# Patient Record
Sex: Female | Born: 1935 | Race: White | Hispanic: No | Smoking: Former smoker
Health system: Southern US, Community
[De-identification: ages and names within clinical notes are randomized; demographics above are authoritative.]

## PROBLEM LIST (undated history)

## (undated) DIAGNOSIS — I2510 Atherosclerotic heart disease of native coronary artery without angina pectoris: Secondary | ICD-10-CM

## (undated) DIAGNOSIS — E785 Hyperlipidemia, unspecified: Secondary | ICD-10-CM

## (undated) DIAGNOSIS — I714 Abdominal aortic aneurysm, without rupture, unspecified: Secondary | ICD-10-CM

## (undated) DIAGNOSIS — I6529 Occlusion and stenosis of unspecified carotid artery: Secondary | ICD-10-CM

## (undated) DIAGNOSIS — S72009A Fracture of unspecified part of neck of unspecified femur, initial encounter for closed fracture: Secondary | ICD-10-CM

## (undated) DIAGNOSIS — I351 Nonrheumatic aortic (valve) insufficiency: Secondary | ICD-10-CM

## (undated) DIAGNOSIS — M1990 Unspecified osteoarthritis, unspecified site: Secondary | ICD-10-CM

## (undated) DIAGNOSIS — I10 Essential (primary) hypertension: Secondary | ICD-10-CM

## (undated) DIAGNOSIS — M8580 Other specified disorders of bone density and structure, unspecified site: Secondary | ICD-10-CM

## (undated) DIAGNOSIS — I639 Cerebral infarction, unspecified: Secondary | ICD-10-CM

## (undated) HISTORY — PX: TONSILLECTOMY: SUR1361

## (undated) HISTORY — PX: FRACTURE SURGERY: SHX138

## (undated) HISTORY — PX: EYE SURGERY: SHX253

## (undated) HISTORY — PX: HIP FRACTURE SURGERY: SHX118

## (undated) HISTORY — PX: ABDOMINAL HYSTERECTOMY: SHX81

## (undated) HISTORY — DX: Nonrheumatic aortic (valve) insufficiency: I35.1

## (undated) HISTORY — DX: Cerebral infarction, unspecified: I63.9

## (undated) HISTORY — DX: Hyperlipidemia, unspecified: E78.5

## (undated) HISTORY — DX: Fracture of unspecified part of neck of unspecified femur, initial encounter for closed fracture: S72.009A

## (undated) HISTORY — DX: Abdominal aortic aneurysm, without rupture: I71.4

## (undated) HISTORY — DX: Essential (primary) hypertension: I10

## (undated) HISTORY — PX: APPENDECTOMY: SHX54

## (undated) HISTORY — DX: Abdominal aortic aneurysm, without rupture, unspecified: I71.40

## (undated) HISTORY — PX: CATARACT EXTRACTION: SUR2

## (undated) HISTORY — DX: Atherosclerotic heart disease of native coronary artery without angina pectoris: I25.10

## (undated) HISTORY — PX: CHOLECYSTECTOMY: SHX55

## (undated) HISTORY — DX: Occlusion and stenosis of unspecified carotid artery: I65.29

## (undated) HISTORY — PX: PTCA: SHX146

## (undated) HISTORY — DX: Other specified disorders of bone density and structure, unspecified site: M85.80

## (undated) HISTORY — DX: Unspecified osteoarthritis, unspecified site: M19.90

---

## 1998-01-27 ENCOUNTER — Emergency Department (HOSPITAL_COMMUNITY): Admission: EM | Admit: 1998-01-27 | Discharge: 1998-01-27 | Payer: Self-pay

## 1998-07-25 HISTORY — PX: CARDIAC CATHETERIZATION: SHX172

## 1998-11-30 ENCOUNTER — Inpatient Hospital Stay (HOSPITAL_COMMUNITY): Admission: EM | Admit: 1998-11-30 | Discharge: 1998-12-03 | Payer: Self-pay | Admitting: Emergency Medicine

## 1998-11-30 ENCOUNTER — Encounter: Payer: Self-pay | Admitting: Emergency Medicine

## 1999-09-17 ENCOUNTER — Encounter: Payer: Self-pay | Admitting: Cardiovascular Disease

## 1999-09-17 ENCOUNTER — Ambulatory Visit (HOSPITAL_COMMUNITY): Admission: RE | Admit: 1999-09-17 | Discharge: 1999-09-17 | Payer: Self-pay | Admitting: Cardiovascular Disease

## 2001-02-08 ENCOUNTER — Encounter: Payer: Self-pay | Admitting: Obstetrics and Gynecology

## 2001-02-08 ENCOUNTER — Encounter: Admission: RE | Admit: 2001-02-08 | Discharge: 2001-02-08 | Payer: Self-pay | Admitting: Obstetrics and Gynecology

## 2003-06-03 ENCOUNTER — Encounter: Admission: RE | Admit: 2003-06-03 | Discharge: 2003-06-03 | Payer: Self-pay | Admitting: Obstetrics and Gynecology

## 2003-08-19 ENCOUNTER — Emergency Department (HOSPITAL_COMMUNITY): Admission: AD | Admit: 2003-08-19 | Discharge: 2003-08-19 | Payer: Self-pay

## 2004-01-24 ENCOUNTER — Emergency Department (HOSPITAL_COMMUNITY): Admission: EM | Admit: 2004-01-24 | Discharge: 2004-01-24 | Payer: Self-pay | Admitting: Family Medicine

## 2004-07-17 ENCOUNTER — Emergency Department (HOSPITAL_COMMUNITY): Admission: EM | Admit: 2004-07-17 | Discharge: 2004-07-17 | Payer: Self-pay | Admitting: Family Medicine

## 2004-08-24 ENCOUNTER — Ambulatory Visit: Payer: Self-pay | Admitting: Internal Medicine

## 2006-01-13 ENCOUNTER — Encounter: Admission: RE | Admit: 2006-01-13 | Discharge: 2006-01-13 | Payer: Self-pay | Admitting: Internal Medicine

## 2006-01-31 ENCOUNTER — Emergency Department (HOSPITAL_COMMUNITY): Admission: EM | Admit: 2006-01-31 | Discharge: 2006-01-31 | Payer: Self-pay | Admitting: Family Medicine

## 2007-05-01 DIAGNOSIS — E785 Hyperlipidemia, unspecified: Secondary | ICD-10-CM

## 2007-05-01 DIAGNOSIS — I10 Essential (primary) hypertension: Secondary | ICD-10-CM

## 2007-05-01 DIAGNOSIS — I2510 Atherosclerotic heart disease of native coronary artery without angina pectoris: Secondary | ICD-10-CM

## 2007-06-05 ENCOUNTER — Encounter: Payer: Self-pay | Admitting: Internal Medicine

## 2007-12-04 ENCOUNTER — Encounter: Payer: Self-pay | Admitting: Internal Medicine

## 2007-12-12 ENCOUNTER — Ambulatory Visit: Payer: Self-pay | Admitting: Vascular Surgery

## 2008-06-03 ENCOUNTER — Encounter: Payer: Self-pay | Admitting: Internal Medicine

## 2008-12-02 ENCOUNTER — Encounter: Payer: Self-pay | Admitting: Internal Medicine

## 2008-12-11 ENCOUNTER — Ambulatory Visit: Payer: Self-pay | Admitting: *Deleted

## 2009-01-15 ENCOUNTER — Ambulatory Visit: Payer: Self-pay | Admitting: Internal Medicine

## 2009-01-15 DIAGNOSIS — M949 Disorder of cartilage, unspecified: Secondary | ICD-10-CM

## 2009-01-15 DIAGNOSIS — R634 Abnormal weight loss: Secondary | ICD-10-CM | POA: Insufficient documentation

## 2009-01-15 DIAGNOSIS — M899 Disorder of bone, unspecified: Secondary | ICD-10-CM | POA: Insufficient documentation

## 2009-01-15 LAB — CONVERTED CEMR LAB
ALT: 14 units/L (ref 0–35)
AST: 19 units/L (ref 0–37)
Albumin: 4 g/dL (ref 3.5–5.2)
Alkaline Phosphatase: 70 units/L (ref 39–117)
BUN: 17 mg/dL (ref 6–23)
Basophils Absolute: 0.1 10*3/uL (ref 0.0–0.1)
Basophils Relative: 1.1 % (ref 0.0–3.0)
Bilirubin, Direct: 0.1 mg/dL (ref 0.0–0.3)
Blood in Urine, dipstick: NEGATIVE
CO2: 26 meq/L (ref 19–32)
Calcium: 9.3 mg/dL (ref 8.4–10.5)
Chloride: 107 meq/L (ref 96–112)
Cholesterol: 141 mg/dL (ref 0–200)
Creatinine, Ser: 1.1 mg/dL (ref 0.4–1.2)
Eosinophils Absolute: 0.3 10*3/uL (ref 0.0–0.7)
Eosinophils Relative: 4.8 % (ref 0.0–5.0)
GFR calc non Af Amer: 50.42 mL/min (ref >60)
Glucose, Bld: 99 mg/dL (ref 70–99)
Glucose, Urine, Semiquant: NEGATIVE
HCT: 43.5 % (ref 36.0–46.0)
HDL: 46.4 mg/dL (ref >39.00)
Hemoglobin: 15.3 g/dL — ABNORMAL HIGH (ref 12.0–15.0)
LDL Cholesterol: 77 mg/dL (ref 0–99)
Lymphocytes Relative: 22.1 % (ref 12.0–46.0)
Lymphs Abs: 1.2 10*3/uL (ref 0.7–4.0)
MCHC: 35.1 g/dL (ref 30.0–36.0)
MCV: 90.1 fL (ref 78.0–100.0)
Monocytes Absolute: 0.5 10*3/uL (ref 0.1–1.0)
Monocytes Relative: 9.9 % (ref 3.0–12.0)
Neutro Abs: 3.4 10*3/uL (ref 1.4–7.7)
Neutrophils Relative %: 62.1 % (ref 43.0–77.0)
Nitrite: NEGATIVE
Platelets: 162 10*3/uL (ref 150.0–400.0)
Potassium: 4.7 meq/L (ref 3.5–5.1)
RBC: 4.82 M/uL (ref 3.87–5.11)
RDW: 12.7 % (ref 11.5–14.6)
Sodium: 140 meq/L (ref 135–145)
Specific Gravity, Urine: 1.025
TSH: 2.32 microintl units/mL (ref 0.35–5.50)
Total Bilirubin: 0.7 mg/dL (ref 0.3–1.2)
Total CHOL/HDL Ratio: 3
Total Protein: 7 g/dL (ref 6.0–8.3)
Triglycerides: 87 mg/dL (ref 0.0–149.0)
Urobilinogen, UA: 0.2
VLDL: 17.4 mg/dL (ref 0.0–40.0)
WBC Urine, dipstick: NEGATIVE
WBC: 5.5 10*3/uL (ref 4.5–10.5)
pH: 5

## 2009-01-29 ENCOUNTER — Telehealth: Payer: Self-pay | Admitting: Internal Medicine

## 2009-04-30 ENCOUNTER — Telehealth: Payer: Self-pay | Admitting: Internal Medicine

## 2009-05-21 ENCOUNTER — Encounter (INDEPENDENT_AMBULATORY_CARE_PROVIDER_SITE_OTHER): Payer: Self-pay

## 2009-05-31 ENCOUNTER — Emergency Department (HOSPITAL_COMMUNITY): Admission: EM | Admit: 2009-05-31 | Discharge: 2009-05-31 | Payer: Self-pay | Admitting: Family Medicine

## 2009-06-02 ENCOUNTER — Encounter (INDEPENDENT_AMBULATORY_CARE_PROVIDER_SITE_OTHER): Payer: Self-pay | Admitting: *Deleted

## 2009-12-15 ENCOUNTER — Encounter: Payer: Self-pay | Admitting: Internal Medicine

## 2010-06-21 ENCOUNTER — Ambulatory Visit: Payer: Self-pay | Admitting: Cardiovascular Disease

## 2010-06-21 ENCOUNTER — Encounter: Payer: Self-pay | Admitting: Internal Medicine

## 2010-07-02 ENCOUNTER — Ambulatory Visit: Payer: Self-pay | Admitting: Vascular Surgery

## 2010-08-24 NOTE — Letter (Signed)
Summary: [facility]   Imported By: Maryln Gottron 12/25/2009 11:05:24  _____________________________________________________________________  External Attachment:    Type:   Image     Comment:   External Document

## 2010-08-24 NOTE — Letter (Signed)
Summary: [facility]   Imported By: Maryln Gottron 06/25/2010 12:16:14  _____________________________________________________________________  External Attachment:    Type:   Image     Comment:   External Document

## 2010-08-26 NOTE — Letter (Signed)
Summary: [facility]   Imported By: Maryln Gottron 07/07/2010 12:15:41  _____________________________________________________________________  External Attachment:    Type:   Image     Comment:   External Document

## 2010-10-27 LAB — POCT URINALYSIS DIP (DEVICE)
Glucose, UA: 100 mg/dL — AB
Ketones, ur: 40 mg/dL — AB
Specific Gravity, Urine: <=1.005 (ref 1.005–1.030)
Urobilinogen, UA: >=8 mg/dL (ref 0.0–1.0)

## 2010-10-27 LAB — URINE CULTURE

## 2010-12-07 NOTE — Procedures (Signed)
DUPLEX ULTRASOUND OF ABDOMINAL AORTA   INDICATION:  Followup evaluation of known abdominal aortic aneurysm.   HISTORY:  Diabetes:  No.  Cardiac:  PTCA with stent over 10 years ago.  Hypertension:  No.  Smoking:  Yes.  Connective Tissue Disorder:  Family History:  Previous Surgery:   DUPLEX EXAM:         AP (cm)                   TRANSVERSE (cm)  Proximal             1.8 cm                    1.6 cm  Mid                  3.6 cm                    3.6 cm  Distal               3.4 cm                    3.3 cm  Right Iliac          0.8 cm                    0.8 cm  Left Iliac           0.7 cm                    0.6 cm   PREVIOUS:  Date:  12/11/2008  AP:  3.6  TRANSVERSE:  3.5   IMPRESSION:  Abdominal aortic aneurysm measurements are stable compared  to previous study.    ___________________________________________  P. Liliane Bade, M.D.   MC/MEDQ  D:  12/11/2008  T:  12/11/2008  Job:  [id number]

## 2010-12-07 NOTE — Procedures (Signed)
DUPLEX ULTRASOUND OF ABDOMINAL AORTA   INDICATION:  Followup abdominal aortic aneurysm.   HISTORY:  Diabetes:  No.  Cardiac:  PTCA.  Hypertension:  No.  Smoking:  Yes.  Family History:  No.  Previous Surgery:  No.   DUPLEX EXAM:         AP (cm)                   TRANSVERSE (cm)  Proximal             2.1 cm                    2.5 cm  Mid                  2.4 cm                    3.0 cm  Distal               3.8 cm                    3.7 cm  Right Iliac          0.88 cm                   1.0 cm  Left Iliac           0.84 cm                   0.90 cm   PREVIOUS:  Date:  AP:  3.6  TRANSVERSE:  3.6   IMPRESSION:  Stable appearing infrarenal abdominal aortic aneurysm  within mid distal aorta with intraluminal thrombus.  Minimal increase in  size compared to previous study.   ___________________________________________  Leonides Sake, MD   OD/MEDQ  D:  07/02/2010  T:  07/02/2010  Job:  [id number]

## 2010-12-07 NOTE — Assessment & Plan Note (Signed)
OFFICE VISIT   [name], [name]  DOB:  04/28/1924                                       07/02/2010  ZDGUY#:[id number]   This is a new patient consultation.  Indication for this consultation is  abdominal aortic aneurysm.  The requesting physician is Vesta Mixer, M.D. from [facility].   HISTORY OF PRESENT ILLNESS:  This is an 77 year old female with known  abdominal aortic aneurysm who presents for followup.  To date she has  had no symptoms from her abdominal aortic aneurysm.  She has had no back  or abdominal pain. She has no back pain or abdominal pain.  She notes no  family history of aneurysmal disease.  Additionally, never has had any  type of connective tissue disorder.  Her risk factors for aneurysm  disease include her age and smoking.   PAST MEDICAL HISTORY:  Included coronary artery disease, the abdominal  aortic aneurysm, hyperlipidemia, aortic insufficiency, tobacco  dependence and weight loss.   PAST SURGICAL HISTORY:  Included a percutaneous coronary intervention  with stenting of the right coronary artery and also a total abdominal  hysterectomy.   SOCIAL HISTORY:  She is widowed and retired.  Continuing to smoke half a  pack a day with a total pack year history of about 40 pack year history.  She denies any alcohol or illicit drug use.   FAMILY HISTORY:  She notes that both mother and father died of natural  causes.  Did not have any active medical problems.   MEDICATIONS:  She lists:  Pravachol, aspirin, Toprol.   ALLERGIES:  Librium and Zocor.   REVIEW OF SYSTEMS:  She noted weight loss.  Rest of her review of  systems was listed as negative in her chart.   PHYSICAL EXAMINATION:  Today, she had a blood pressure 193/63, heart  rate of 53, respirations of 12, satting 99% on room air.  General:  She was in no apparent distress, somewhat cachectic.  Head:  Normocephalic, atraumatic.  Some temporalis  wasting.  ENT:  Oropharynx demonstrated some erythema with tonsillar edema.  Nares  without any erythema or drainage.  Hearing was grossly decreased  bilaterally.  Neck:  Supple neck with no nuchal rigidity.  No cervical  lymphadenopathy.  Eyes:  Pupils were equal, round, reactive to light.  Extraocular  movements were intact.  Pulmonary:  Symmetric expansion with good air movement.  No rales,  rhonchi or wheezing.  Cardiac:  Regular rate and rhythm.  Normal S1-S2.  No murmurs, rubs,  thrills or gallops.  Vascular:  She had palpable pulses in all extremities.  Abdomen:  She has soft abdomen with easily palpable aorta, in fact her  aorta was visible pulsation due to her relatively cachectic build.  There were no masses otherwise or any hepatosplenomegaly.  Musculoskeletal:  She had 5/5 strength in all extremities.  There were  no ulcerations or gangrene in any extremity.  Neurological:  Cranial nerves II-XII were intact.  She had motor exam as  above.  Sensation in all extremities were grossly intact.  Psychiatric:  Her judgment was intact.  Her mood and affect were  appropriate for her clinical situation.  Lymphatic:  She had no cervical, axillary or inguinal lymphadenopathy.  Skin:  The  extremities were as listed above.  Otherwise there were no  obvious rashes noted elsewhere.   NONINVASIVE VASCULAR IMAGING:  She had a duplex ultrasound abdominal  aorta.  It demonstrates maximal diameter transversely at 3.7 cm,  previously 3.6 cm so this is a minimal change in her diameter.   MEDICAL DECISION MAKING:  This is an 77 year old female with a small  abdominal aortic aneurysm currently at 3.7 cm in transverse maximal  diameter, no significant change from her previous study.  At 3.7 cm I  discussed with her rupture risk is probably on the order of 1%-2%,  significantly less than her risk of operative intervention.  There have  been multiple recent studies of EVAR on small aneurysms and  they  demonstrate no advantage to proceeding with EVAR.  I recommended to this  patient that she quit smoking as this has been shown in multiple  retrospective studies to be the #1 risk factor for progression of  aneurysmal growth.  She said that she will make attempts on her own to  quit smoking and if she has difficulty she will come back for possible  pharmacologic and counseling to help with his day.  At 3.7 cm her  surveillance should be annually and I suspect that with her advanced age  that she will not need any interventions.   Thank you for giving [place] the opportunity to participate in this patient's  care.  We will continue to follow her with the aortic surveillance  protocol.     Leonides Sake, MD  Electronically Signed   BC/MEDQ  D:  07/02/2010  T:  07/05/2010  Job:  [id number]   cc:   Vesta Mixer, M.D.

## 2010-12-07 NOTE — Assessment & Plan Note (Signed)
[facility]                                 ON-CALL NOTE   TWYLAH, BENNETTS                      MRN:          [id number]  DATE:05/31/2009                            DOB:          11/29/1923    PHONE NUMBER:  [phone].   PRIMARY CARE PHYSICIAN:  Gordy Savers, MD   SUBJECTIVE:  She states that within the last 10-15 minutes, she started  noticing burning with urination, blood in her urine, and suprapubic  pain.  She states, I have cystitis again.   ASSESSMENT AND PLAN:  I discussed with the patient that we do not call  in antibiotics without seeing the patient first.  There is no [place]  associated working clinic today, so she was recommended to go to [facility] for a urinalysis and likely antibiotic treatment.     Kerby Nora, MD  Electronically Signed    AB/MedQ  DD: 05/31/2009  DT: 06/01/2009  Job #: [id number]

## 2010-12-07 NOTE — Procedures (Signed)
DUPLEX ULTRASOUND OF ABDOMINAL AORTA   INDICATION:  Pulsatile abdominal mass.   HISTORY:  Diabetes:  No.  Cardiac:  PTCA with stents nearly 10 years ago.  Hypertension:  No.  Smoking:  Yes.  Connective Tissue Disorder:  Family History:  No.  Previous Surgery:  No.   DUPLEX EXAM:         AP (cm)                   TRANSVERSE (cm)  Proximal             2.6 cm                    2.3 cm  Mid                  2.7 cm                    3.0 cm  Distal               3.6 cm                    3.5 cm  Right Iliac          Not visualized            Not visualized  Left Iliac           Not visualized            Not visualized   PREVIOUS:  Date:  06/08/2006  AP:  3.0  TRANSVERSE:  3.3   IMPRESSION:  1. Known abdominal aortic aneurysm of the mid to distal aorta with a      mild increase in maximum diameter noted when compared to previous      exam on 06/08/2006.  2. Unable to visualize the bilateral iliac arteries due to overlying      bowel gas.   ___________________________________________  Larina Earthly, M.D.   CH/MEDQ  D:  12/12/2007  T:  12/12/2007  Job:  [id number]

## 2010-12-10 NOTE — Assessment & Plan Note (Signed)
[facility]                             PRIMARY CARE ON-CALL NOTE   [name], [name]                        MRN:          [id number]  DATE:01/31/2006                            DOB:          April 22, 1924    TELEPHONE TRIAGE NOTE   TIME RECEIVED:  7:59 p.m.   The patient sees Gordy Savers, MD.  The caller is her son.  Telephone [phone].  Over the past two days, the patient has had difficulty  urinating despite increased pressure to urinate.  Also lower abdominal pain  and blood in the urine.  There has been no fever and no vomiting.  She is  drinking plenty of fluids.  They are currently waiting at [facility] now to be seen.  My response is that this does sound like a  urinary tract infection.  They should not continue to wait and let the  urgent care staff evaluate and treat her tonight.                                   Tera Mater. Clent Ridges, MD   SAF/MedQ  DD:  01/31/2006  DT:  02/01/2006  Job #:  [id number]

## 2011-01-09 ENCOUNTER — Inpatient Hospital Stay (HOSPITAL_COMMUNITY)
Admission: EM | Admit: 2011-01-09 | Discharge: 2011-01-13 | DRG: 481 | Disposition: A | Discharge status: Skilled Nursing Facility | Payer: Medicare Other | Attending: Internal Medicine | Admitting: Internal Medicine

## 2011-01-09 ENCOUNTER — Emergency Department (HOSPITAL_COMMUNITY): Payer: Medicare Other

## 2011-01-09 DIAGNOSIS — W010XXA Fall on same level from slipping, tripping and stumbling without subsequent striking against object, initial encounter: Secondary | ICD-10-CM | POA: Diagnosis present

## 2011-01-09 DIAGNOSIS — I10 Essential (primary) hypertension: Secondary | ICD-10-CM | POA: Diagnosis present

## 2011-01-09 DIAGNOSIS — L89109 Pressure ulcer of unspecified part of back, unspecified stage: Secondary | ICD-10-CM | POA: Diagnosis present

## 2011-01-09 DIAGNOSIS — D62 Acute posthemorrhagic anemia: Secondary | ICD-10-CM | POA: Diagnosis not present

## 2011-01-09 DIAGNOSIS — Z880 Allergy status to penicillin: Secondary | ICD-10-CM

## 2011-01-09 DIAGNOSIS — S72143A Displaced intertrochanteric fracture of unspecified femur, initial encounter for closed fracture: Principal | ICD-10-CM | POA: Diagnosis present

## 2011-01-09 DIAGNOSIS — L8991 Pressure ulcer of unspecified site, stage 1: Secondary | ICD-10-CM | POA: Diagnosis present

## 2011-01-09 DIAGNOSIS — I2510 Atherosclerotic heart disease of native coronary artery without angina pectoris: Secondary | ICD-10-CM | POA: Diagnosis present

## 2011-01-09 DIAGNOSIS — F17200 Nicotine dependence, unspecified, uncomplicated: Secondary | ICD-10-CM | POA: Diagnosis present

## 2011-01-09 DIAGNOSIS — Z7982 Long term (current) use of aspirin: Secondary | ICD-10-CM

## 2011-01-09 LAB — DIFFERENTIAL
Basophils Relative: 0 % (ref 0–1)
Eosinophils Relative: 3 % (ref 0–5)
Lymphs Abs: 1.1 10*3/uL (ref 0.7–4.0)
Monocytes Absolute: 0.9 10*3/uL (ref 0.1–1.0)
Monocytes Relative: 7 % (ref 3–12)
Neutro Abs: 10.2 10*3/uL — ABNORMAL HIGH (ref 1.7–7.7)
WBC Morphology: INCREASED

## 2011-01-09 LAB — CBC
HCT: 37.7 % (ref 36.0–46.0)
Hemoglobin: 12.9 g/dL (ref 12.0–15.0)
MCH: 30 pg (ref 26.0–34.0)
MCHC: 34.2 g/dL (ref 30.0–36.0)
MCV: 87.7 fL (ref 78.0–100.0)
RDW: 13.1 % (ref 11.5–15.5)

## 2011-01-09 LAB — URINALYSIS, ROUTINE W REFLEX MICROSCOPIC
Bilirubin Urine: NEGATIVE
Glucose, UA: NEGATIVE mg/dL
Leukocytes, UA: NEGATIVE
Nitrite: NEGATIVE
Specific Gravity, Urine: 1.014 (ref 1.005–1.030)
pH: 7 (ref 5.0–8.0)

## 2011-01-09 LAB — BASIC METABOLIC PANEL
BUN: 14 mg/dL (ref 6–23)
CO2: 26 mEq/L (ref 19–32)
Chloride: 102 mEq/L (ref 96–112)
Creatinine, Ser: 0.99 mg/dL (ref 0.50–1.10)
GFR calc Af Amer: >60 mL/min (ref >60)
Potassium: 4.6 mEq/L (ref 3.5–5.1)

## 2011-01-09 LAB — PROTIME-INR: INR: 1.04 (ref 0.00–1.49)

## 2011-01-10 ENCOUNTER — Inpatient Hospital Stay (HOSPITAL_COMMUNITY): Payer: Medicare Other

## 2011-01-10 LAB — BASIC METABOLIC PANEL
Calcium: 8.6 mg/dL (ref 8.4–10.5)
GFR calc non Af Amer: 51 mL/min — ABNORMAL LOW (ref >60)
Potassium: 4.5 mEq/L (ref 3.5–5.1)
Sodium: 132 mEq/L — ABNORMAL LOW (ref 135–145)

## 2011-01-10 LAB — MRSA PCR SCREENING: MRSA by PCR: NEGATIVE

## 2011-01-11 LAB — COMPREHENSIVE METABOLIC PANEL
ALT: 10 U/L (ref 0–35)
AST: 12 U/L (ref 0–37)
Alkaline Phosphatase: 46 U/L (ref 39–117)
CO2: 25 mEq/L (ref 19–32)
Chloride: 101 mEq/L (ref 96–112)
GFR calc non Af Amer: 57 mL/min — ABNORMAL LOW (ref >60)
Potassium: 4.4 mEq/L (ref 3.5–5.1)
Sodium: 131 mEq/L — ABNORMAL LOW (ref 135–145)
Total Bilirubin: 0.3 mg/dL (ref 0.3–1.2)

## 2011-01-11 LAB — CBC
Hemoglobin: 9.2 g/dL — ABNORMAL LOW (ref 12.0–15.0)
Platelets: 117 10*3/uL — ABNORMAL LOW (ref 150–400)
RBC: 3.03 MIL/uL — ABNORMAL LOW (ref 3.87–5.11)
WBC: 7.6 10*3/uL (ref 4.0–10.5)

## 2011-01-12 LAB — BASIC METABOLIC PANEL
Calcium: 7.8 mg/dL — ABNORMAL LOW (ref 8.4–10.5)
GFR calc non Af Amer: >60 mL/min (ref >60)
Glucose, Bld: 117 mg/dL — ABNORMAL HIGH (ref 70–99)
Sodium: 131 mEq/L — ABNORMAL LOW (ref 135–145)

## 2011-01-12 LAB — CBC
Hemoglobin: 8.8 g/dL — ABNORMAL LOW (ref 12.0–15.0)
MCH: 30.9 pg (ref 26.0–34.0)
MCHC: 35.6 g/dL (ref 30.0–36.0)
Platelets: 111 10*3/uL — ABNORMAL LOW (ref 150–400)
RDW: 13.2 % (ref 11.5–15.5)

## 2011-01-13 LAB — BASIC METABOLIC PANEL
CO2: 23 mEq/L (ref 19–32)
Calcium: 8.1 mg/dL — ABNORMAL LOW (ref 8.4–10.5)
GFR calc non Af Amer: >60 mL/min (ref >60)
Sodium: 133 mEq/L — ABNORMAL LOW (ref 135–145)

## 2011-01-13 LAB — CBC
MCH: 30.2 pg (ref 26.0–34.0)
Platelets: 135 10*3/uL — ABNORMAL LOW (ref 150–400)
RBC: 2.95 MIL/uL — ABNORMAL LOW (ref 3.87–5.11)

## 2011-01-13 NOTE — Op Note (Signed)
**Note Sierra via Obfuscation** [name], Kennedy NO.:  [id number]  MEDICAL RECORD NO.:  [id number]  LOCATION:  5006                         FACILITY:  [facility]  PHYSICIAN:  Toni Arthurs, MD        DATE OF BIRTH:  07-28-1923  DATE OF PROCEDURE:  01/10/2011 DATE OF DISCHARGE:                              OPERATIVE REPORT   PREOPERATIVE DIAGNOSIS:  Left hip intertrochanteric fracture.  POSTOPERATIVE DIAGNOSIS:  Left hip intertrochanteric fracture.  PROCEDURE: 1. Left hip intramedullary nail. 2. Intraoperative interpretation of fluoroscopic images.  SURGEON:  Toni Arthurs, MD  ANESTHESIA:  General.  IV FLUIDS:  See anesthesia record.  ESTIMATED BLOOD LOSS:  100 mL.  COMPLICATIONS:  None apparent.  DISPOSITION:  Extubated, awake, and stable to recovery.  INDICATIONS FOR PROCEDURE:  The patient is an 77 year old female who fell yesterday at home landing on her left side.  She sustained a fracture of her left hip.  She presents now for operative treatment of this injury.  She understands the risks and benefits of this procedure as well as the alternative treatment options and would like to proceed. She understands specifically risks of bleeding, infection, nerve damage, blood clots, need for additional surgery, failure to heal, chronic pain, and death.  PROCEDURE IN DETAIL:  After preoperative consent was obtained, the correct operative site was identified, the patient was brought to the operating room and placed supine on the gurney.  General anesthesia was induced.  Preoperative antibiotics were administered.  The patient was then transferred onto the fracture table in the supine position.  The left lower extremity was placed in a traction boot.  AP and lateral x- rays of the hip were obtained with fluoroscopy.  A reduction maneuver of traction, adduction, and internal rotation was performed.  AP and lateral x-rays were obtained showing appropriate reduction of the fracture.  The  left lower extremity was then prepped and draped in standard sterile fashion.  A longitudinal incision was marked on the skin just proximal from the tip of the greater trochanter.  The incision was carried down through the IT band and the gluteus fascia.  A guidepin was then placed just medial to the tip of the greater trochanter.  It was advanced into the femoral canal.  Appropriate position was verified on AP and lateral views.  The starter reamer was then inserted and reamed to its maximal depth.  The ball-tip guidewire was then inserted down the femoral canal to the level of the superior pole of the patella. The length was measured and appropriate size nail was selected.  The femoral canal was then sequentially reamed to 13 mm in diameter and 11 mm x 360 mm Affixus hip fracture nail was then selected.  It was inserted over the guidewire and driven in maximally with gentle blows from a mallet.  The appropriate depth was confirmed on AP fluoroscopy images.  The lateral nail insertion guide was then utilized to insert a drill sleeve into the skin through a small stab incision.  The sleeve was seated against the lateral cortex of the femur.  AP and lateral views showed appropriate position of the drill guide.  A guide pin was  then inserted through the drill guide and advanced into the femoral head.  It was noted to be in appropriate position.  The depth was measured and a 90-mm screw was selected.  The guide pin was reamed to a depth of 90 mm.  The 90-mm screw was inserted and positions adjacent to the subchondral bone in both the AP and lateral planes.  The compression screw was then applied and tightened compressing the fracture appropriately.  The insertion handle was then tightened to engage the screw to prevent it from backing out but allowed to slide.  The insertion handle was removed in its entirety.  Final AP and lateral views of the proximal femur showed appropriate reduction of  the fracture and appropriate position and length of the hardware.  Attention was then turned to the distal aspect of the leg.  The perfect circle technique was used to insert an interlocking screw through the proximal hole in the distal segment of the nail.  This was done percutaneously.  All wounds were then irrigated copiously.  Final AP and lateral views of the knee showed appropriate position and length of the nail and the interlocked screw.  The wounds were closed with 0 Vicryl at the level of the IT band, 2-0 Vicryl at the subcutaneous tissue, and running 3-0 nylon sutures to close the skin.  Sterile dressings were applied.  The patient was then awaken from anesthesia and transported to the recovery room in stable condition.  FOLLOWUP PLAN:  The patient will be weightbearing as tolerated on her left lower extremity.  She will have physical therapy, occupational therapy, and case management consultations for disposition.     Toni Arthurs, MD     JH/MEDQ  D:  01/10/2011  T:  01/11/2011  Job:  [id number]  Electronically Signed by Toni Arthurs  on 01/13/2011 12:07:46 PM

## 2011-01-13 NOTE — Consult Note (Signed)
  NAMEFLORABEL, FAULKS NO.:  [id number]  MEDICAL RECORD NO.:  [id number]  LOCATION:                                 FACILITY:  PHYSICIAN:  Toni Arthurs, MD        DATE OF BIRTH:  1924/05/30  DATE OF CONSULTATION: DATE OF DISCHARGE:                                CONSULTATION   REASON FOR CONSULTATION:  Left hip fracture.  HISTORY OF PRESENT ILLNESS:  The patient is an 77 year old woman, who fell today at home.  She complains of dull aching pain in the left hip that is sharp and severe when she tries to move.  She denies numbness, tingling, or weakness in the left lower extremity.  PAST MEDICAL HISTORY:  Coronary artery disease, hypertension, abdominal aortic aneurysm, cervical cancer, hyperlipidemia.  SOCIAL HISTORY:  The patient smokes cigarettes.  She does not drink any alcohol.  FAMILY HISTORY:  Her mother died at 23 from unknown causes.  Father died at 30 also from unknown causes.  REVIEW OF SYSTEMS:  No recent fever, chills, nausea, vomiting, or change in her appetite.  Review of systems as above and otherwise negative.  PHYSICAL EXAM:  The patient is an elderly woman, in no apparent stress. She is alert and oriented x4.  Mood and affect normal.  Extraocular motions are intact.  Respirations are unlabored.  She was seen lying in bed supine.  The left lower extremity was shortened and externally rotated.  She is tender to palpation about the hip.  Skin is healthy and intact.  No lymphadenopathy.  Pulses are palpable in the left foot.  She has active plantarflexion and dorsiflexion of the toes and ankle as well as intact sensibility to light touch throughout the foot.  X-RAYS:  AP pelvis and cross-table lateral of the left hip show a comminuted intertrochanteric fracture.  ASSESSMENT:  Left hip intertrochanteric fracture.  PLAN:  I explained the nature of this injury to the patient in detail in order to allow her the quickest recovery possible.   We will plan to fix her hip tomorrow.  She understands the risks and benefits of this procedure as well as the alternative treatment options.  She would like to proceed.  We are going to scheduled for surgery tomorrow.  This is a severe injury, requiring complex medical decision making, and is a high risk for loss of function.     Toni Arthurs, MD     JH/MEDQ  D:  01/10/2011  T:  01/10/2011  Job:  [id number]  Electronically Signed by Jonny Ruiz Mercedez Boule  on 01/13/2011 12:07:38 PM

## 2011-01-22 NOTE — H&P (Signed)
NAME:  [name], [name] NO.:  [id number]  MEDICAL RECORD NO.:  [id number]  LOCATION:                                 FACILITY:  PHYSICIAN:  Celso Amy, MD   DATE OF BIRTH:  03/13/1924  DATE OF ADMISSION: DATE OF DISCHARGE:                             HISTORY & PHYSICAL   PRIMARY CARE DOCTOR:  Gordy Savers, MD.  CARDIOLOGIST:  Vesta Mixer, MD.  VASCULAR SURGEON:  Fransisco Hertz, MD.  CHIEF COMPLAINT:  Fall.  HISTORY OF PRESENT ILLNESS:  The patient is an 77 year old white female with a past medical history of AAA, coronary artery disease, who presents with chief complaint of fall.  History of present illness dates back to 9 a.m. today when the patient was getting dressed and she tripped and had a fall.  The patient does not remember the exact duration of her fall.  After the fall, the patient was not able to bear weight and she called to the phone and called EMS.  The patient complains of pain in the left hip, but now the pain is better after the patient received pain medication.  No complaint of loss of consciousness.  No complaint of head trauma.  No complaint of any focal weakness.  No complaint of change in vision.  No complaint of chest pain or shortness of breath.  No complaint of nausea, vomiting, diarrhea.  ALLERGIES:  The patient is allergic to LIBRIUM and PENICILLIN.  SOCIAL HISTORY:  The patient continue to smoke.  The patient is a nondrinker.  FAMILY HISTORY:  Mother died at the age of 20.  Father died at the age of 68.  The patient is not sure from what disease is.  REVIEW OF SYSTEMS:  Negative besides the HPI.  PAST MEDICAL HISTORY: 1. Coronary artery disease. 2. Status post PCI. 3. Abdominal aortic aneurysm. 4. Hyperlipidemia. 5. Aortic insufficiency. 6. Tobacco abuse.  MEDICATIONS:  As outpatient, the patient is on; 1. Aspirin 81 mg p.o. daily. 2. Metoprolol 25 mg p.o. b.i.d. 3. Pravachol 40 mg 1 tablet p.o. at  bedtime.  PHYSICAL EXAMINATION:  VITAL SIGNS:  Blood pressure 114/48, pulse 64, respiratory rate 18, temperature is afebrile. GENERAL:  The patient is awake, alert, oriented to time, place, and person, is well built, well nourished. HEENT:  Pupils equally reactive to light and accommodation.  Extraocular movement is intact. NECK:  Supple.  Head was atraumatic, normocephalic. RESPIRATORY:  No acute respiratory distress. CHEST:  Clear to auscultation bilaterally. CARDIOVASCULAR:  S1-S2 is regular in rate and rhythm.  No murmurs were appreciated. GI:  Deep bowel sounds present.  Abdomen is soft, nontender, nondistended.  There is a pulsating abdominal mass. EXTREMITIES:  No lower extremity edema.  Left hip is tender minimally. CNS:  Cranial nerves II-XII are grossly intact.  The patient's strength in upper extremities are normal. PSYCH:  The patient has normal and affect.  LABS:  Sodium 135, potassium 4.6, serum chloride 102, bicarb 26, BUN 14, serum creatinine 0.9, glucose 99.  WBC 12.6, hemoglobin 12.9, platelet 166.  PT 13.8, PTT 27, INR 1.0, calcium 8.9.  UA is negative.  Chest x- ray shows  no evidence of acute cardiopulmonary disease.  The patient's left hip x-ray shows intratrochanteric left hip fracture, nondisplaced. The patient's ultrasound of her abdomen done on July 02, 2010 shows anterior-posterior diameter and transfer diameter of 3.6.  The patient's EKG done today shows normal sinus rhythm.  There is mild first-degree AV block.  IMPRESSION: 1. Ortho:  The patient is admitted with left hip fracture.  The     patient is being admitted to Medicine because of her ASA     calcification.  The patient has a history of coronary disease and     abdominal aortic aneurysm. 2. Vascular:  The patient has history of abdominal aortic aneurysm,     right now is clinically stable. 3. Coronary artery disease.  The patient has history of coronary     artery disease, now stable. 4.  Hypertension.  The patient's hypertension right now at goal. 5. DVT.  The patient is high risk for DVT and we will continue on DVT     prophylaxis.  PLAN: 1. We will admit the patient to Medicine. 2. Ortho consult has already been called by the ER, awaiting further     recommendation. 3. We will keep the patient on DVT prophylaxis. 4. We will continue her on outpatient medication. 5. Further course depends how the patient does during this admission.     Celso Amy, MD     MB/MEDQ  D:  01/09/2011  T:  01/10/2011  Job:  [id number]  Electronically Signed by Celso Amy M.D. on 01/22/2011 11:35:37 AM

## 2011-02-08 NOTE — Discharge Summary (Signed)
  NAMEMarland Kitchen  [name], [name] NO.:  [id number]  MEDICAL RECORD NO.:  [id number]  LOCATION:  5006                         FACILITY:  [facility]  PHYSICIAN:  Zannie Cove, MD     DATE OF BIRTH:  01/17/1924  DATE OF ADMISSION:  01/09/2011 DATE OF DISCHARGE:                        DISCHARGE SUMMARY - REFERRING   PRIMARY CARE PHYSICIAN:  Gordy Savers, MD  CARDIOLOGIST:  Vesta Mixer, MD  VASCULAR SURGEON:  Fransisco Hertz, MD  DISCHARGE DIAGNOSES: 1. Left hip intertrochanteric fracture status post intramedullary     nail. 2. History of coronary artery disease status post PCI. 3. History of infrarenal AAA, maximum diameter of 3.8 cm, stable. 4. History of dyslipidemia. 5. History of aortic insufficiency. 6. History of tobacco use.  DISCHARGE MEDICATIONS: 1. Lovenox 40 mg subcu daily for 10-14 days until ambulatory. 2. Oxycodone 5-10 mg p.o. q.4h. p.r.n. for pain. 3. Enteric-coated aspirin 81 mg daily. 4. Metoprolol tartrate 25 mg p.o. b.i.d. 5. Pravachol 40 mg p.o. at bedtime.  CONSULTANT:  Dr. Toni Arthurs with Orthopedics.  PROCEDURES: 1. Left hip intramedullary nail on January 11, 2011. 2. Images; x-ray of the hip on January 09, 2011, intertrochanteric     nondisplaced left hip fracture.  Chest x-ray on January 11, 2011, no     evidence of active cardiopulmonary disease.  Repeat chest x-ray on     January 11, 2011, ORIF left intertrochanteric fracture with no adverse     features.  HISTORY OF PRESENT ILLNESS:  [name] is a very pleasant 77 year old Caucasian female with history of coronary artery disease presented to the hospital after a fall and hip pain evaluation.  She was found to have an intertrochanteric fracture.  For intertrochanteric fracture went to the OR, subsequent day had ORIF. She tolerated the procedure well, has been getting physical therapy and Lovenox for DVT prophylaxis.  Continued to clinically improve and is being sent to skilled nursing  facility for rehab.  Rest of her chronic medical problems remained stable.  In terms of DVT prophylaxis, the patient is at high risk for DVT, hence is prescribed Lovenox for at least 10-14 days until she is more ambulatory.  DISCHARGE FOLLOWUP:  Dr. Toni Arthurs in 2 weeks, call [phone] for appointment.     Zannie Cove, MD     PJ/MEDQ  D:  01/13/2011  T:  01/13/2011  Job:  [id number]  cc:   Toni Arthurs, MD Gordy Savers, MD  Electronically Signed by Zannie Cove  on 02/08/2011 07:56:38 PM

## 2011-02-23 ENCOUNTER — Encounter: Payer: Self-pay | Admitting: Internal Medicine

## 2011-02-24 ENCOUNTER — Ambulatory Visit (INDEPENDENT_AMBULATORY_CARE_PROVIDER_SITE_OTHER): Payer: Medicare Other | Admitting: Internal Medicine

## 2011-02-24 ENCOUNTER — Encounter: Payer: Self-pay | Admitting: Internal Medicine

## 2011-02-24 DIAGNOSIS — I10 Essential (primary) hypertension: Secondary | ICD-10-CM

## 2011-02-24 DIAGNOSIS — I2510 Atherosclerotic heart disease of native coronary artery without angina pectoris: Secondary | ICD-10-CM

## 2011-02-24 DIAGNOSIS — E785 Hyperlipidemia, unspecified: Secondary | ICD-10-CM

## 2011-02-24 MED ORDER — METOPROLOL SUCCINATE ER 50 MG PO TB24: 50.0000 mg | ORAL_TABLET | Freq: Every day | ORAL | Status: DC

## 2011-02-24 MED ORDER — PRAVASTATIN SODIUM 40 MG PO TABS: 40.0000 mg | ORAL_TABLET | Freq: Every day | ORAL | Status: DC

## 2011-02-24 NOTE — Patient Instructions (Signed)
Limit your sodium (Salt) intake  Take 81 mg of aspirin daily  Take a calcium supplement, plus [phone] units of vitamin D

## 2011-02-24 NOTE — Progress Notes (Signed)
  Subjective:    Patient ID: [name], female    DOB: 04-09-1924, 77 y.o.   MRN: [id number]  HPI  77 year old patient who is seen today for followup. She was discharged from the hospital approximately 6 weeks ago following a left hip intertrochanteric fracture. She is status post intramedullary nail. She has a history of coronary artery disease status post PCI which has been stable she has dyslipidemia and a history of tobacco use. She has successfully discontinued tobacco use following her hospital discharge. She was admitted to the [facility]  living  rehabilitation and has been home for one week and is using a walker and doing quite well.  She denies any cardiopulmonary complaints.   Review of Systems  Constitutional: Negative.   HENT: Negative for hearing loss, congestion, sore throat, rhinorrhea, dental problem, sinus pressure and tinnitus.   Eyes: Negative for pain, discharge and visual disturbance.  Respiratory: Negative for cough and shortness of breath.   Cardiovascular: Negative for chest pain, palpitations and leg swelling.  Gastrointestinal: Negative for nausea, vomiting, abdominal pain, diarrhea, constipation, blood in stool and abdominal distention.  Genitourinary: Negative for dysuria, urgency, frequency, hematuria, flank pain, vaginal bleeding, vaginal discharge, difficulty urinating, vaginal pain and pelvic pain.  Musculoskeletal: Positive for gait problem. Negative for joint swelling and arthralgias.  Skin: Negative for rash.  Neurological: Negative for dizziness, syncope, speech difficulty, weakness, numbness and headaches.  Hematological: Negative for adenopathy.  Psychiatric/Behavioral: Negative for behavioral problems, dysphoric mood and agitation. The patient is not nervous/anxious.        Objective:   Physical Exam  Constitutional: She is oriented to person, place, and time. She appears well-developed and well-nourished.       Alert no distress. Patient is using a 4  point walker. Repeat blood pressure 130/75  HENT:  Head: Normocephalic.  Right Ear: External ear normal.  Left Ear: External ear normal.  Mouth/Throat: Oropharynx is clear and moist.  Eyes: Conjunctivae and EOM are normal. Pupils are equal, round, and reactive to light.  Neck: Normal range of motion. Neck supple. No thyromegaly present.  Cardiovascular: Normal rate, regular rhythm, normal heart sounds and intact distal pulses.   Pulmonary/Chest: Effort normal and breath sounds normal.  Abdominal: Soft. Bowel sounds are normal. She exhibits no mass. There is no tenderness.  Musculoskeletal: Normal range of motion. She exhibits no edema.  Lymphadenopathy:    She has no cervical adenopathy.  Neurological: She is alert and oriented to person, place, and time.  Skin: Skin is warm and dry. No rash noted.  Psychiatric: She has a normal mood and affect. Her behavior is normal.          Assessment & Plan:   Status post left hip fracture Osteopenia. We'll recommend calcium and vitamin D Coronary artery disease stable Dyslipidemia stable  Recheck in 6 months

## 2011-03-22 ENCOUNTER — Telehealth: Payer: Self-pay | Admitting: Internal Medicine

## 2011-03-22 NOTE — Telephone Encounter (Signed)
F/u Ortho.  

## 2011-03-22 NOTE — Telephone Encounter (Signed)
Please advise 

## 2011-03-22 NOTE — Telephone Encounter (Signed)
Spoke with pt - informed KIK

## 2011-03-22 NOTE — Telephone Encounter (Signed)
Pts daughter called re: problems with pts leg from broken hip. Pt has been in a lot of pain so the doctor at [facility] wrote pt a script for Oxycodone 5 mg tabs. Pt is not walking as well since after the surgery on hip. Pt is wondering if maybe she has arthiritis? Should pt make ov to see Dr Amador Cunas or the surgeon?

## 2011-05-08 ENCOUNTER — Inpatient Hospital Stay (HOSPITAL_COMMUNITY): Payer: Medicare Other

## 2011-05-08 ENCOUNTER — Emergency Department (HOSPITAL_COMMUNITY): Payer: Medicare Other

## 2011-05-08 ENCOUNTER — Inpatient Hospital Stay (HOSPITAL_COMMUNITY)
Admission: EM | Admit: 2011-05-08 | Discharge: 2011-05-09 | DRG: 069 | Disposition: A | Discharge status: Home / Self Care | Payer: Medicare Other | Attending: Internal Medicine | Admitting: Internal Medicine

## 2011-05-08 DIAGNOSIS — N390 Urinary tract infection, site not specified: Secondary | ICD-10-CM | POA: Diagnosis not present

## 2011-05-08 DIAGNOSIS — Z7982 Long term (current) use of aspirin: Secondary | ICD-10-CM

## 2011-05-08 DIAGNOSIS — Z87891 Personal history of nicotine dependence: Secondary | ICD-10-CM

## 2011-05-08 DIAGNOSIS — I10 Essential (primary) hypertension: Secondary | ICD-10-CM | POA: Diagnosis present

## 2011-05-08 DIAGNOSIS — Z79899 Other long term (current) drug therapy: Secondary | ICD-10-CM

## 2011-05-08 DIAGNOSIS — G459 Transient cerebral ischemic attack, unspecified: Principal | ICD-10-CM | POA: Diagnosis present

## 2011-05-08 DIAGNOSIS — Z8541 Personal history of malignant neoplasm of cervix uteri: Secondary | ICD-10-CM

## 2011-05-08 DIAGNOSIS — I714 Abdominal aortic aneurysm, without rupture, unspecified: Secondary | ICD-10-CM | POA: Diagnosis present

## 2011-05-08 DIAGNOSIS — Z9861 Coronary angioplasty status: Secondary | ICD-10-CM

## 2011-05-08 DIAGNOSIS — I2510 Atherosclerotic heart disease of native coronary artery without angina pectoris: Secondary | ICD-10-CM | POA: Diagnosis present

## 2011-05-08 DIAGNOSIS — E785 Hyperlipidemia, unspecified: Secondary | ICD-10-CM | POA: Diagnosis present

## 2011-05-08 DIAGNOSIS — I359 Nonrheumatic aortic valve disorder, unspecified: Secondary | ICD-10-CM | POA: Diagnosis present

## 2011-05-08 LAB — CBC
Hemoglobin: 12.7 g/dL (ref 12.0–15.0)
MCH: 28.9 pg (ref 26.0–34.0)
RBC: 4.39 MIL/uL (ref 3.87–5.11)
WBC: 8.1 10*3/uL (ref 4.0–10.5)

## 2011-05-08 LAB — DIFFERENTIAL
Basophils Relative: 1 % (ref 0–1)
Monocytes Relative: 8 % (ref 3–12)
Neutro Abs: 5.8 10*3/uL (ref 1.7–7.7)
Neutrophils Relative %: 72 % (ref 43–77)

## 2011-05-08 LAB — COMPREHENSIVE METABOLIC PANEL
ALT: 8 U/L (ref 0–35)
Alkaline Phosphatase: 77 U/L (ref 39–117)
BUN: 17 mg/dL (ref 6–23)
CO2: 24 mEq/L (ref 19–32)
Chloride: 102 mEq/L (ref 96–112)
GFR calc Af Amer: 55 mL/min — ABNORMAL LOW (ref >90)
GFR calc non Af Amer: 47 mL/min — ABNORMAL LOW (ref >90)
Glucose, Bld: 113 mg/dL — ABNORMAL HIGH (ref 70–99)
Potassium: 4.2 mEq/L (ref 3.5–5.1)
Total Bilirubin: 0.3 mg/dL (ref 0.3–1.2)

## 2011-05-08 LAB — POCT I-STAT, CHEM 8
BUN: 17 mg/dL (ref 6–23)
Calcium, Ion: 1.15 mmol/L (ref 1.12–1.32)
Chloride: 103 mEq/L (ref 96–112)
Creatinine, Ser: 1.1 mg/dL (ref 0.50–1.10)
Glucose, Bld: 112 mg/dL — ABNORMAL HIGH (ref 70–99)
HCT: 39 % (ref 36.0–46.0)
Hemoglobin: 13.3 g/dL (ref 12.0–15.0)
Potassium: 4.2 mEq/L (ref 3.5–5.1)
Sodium: 136 mEq/L (ref 135–145)
TCO2: 24 mmol/L (ref 0–100)

## 2011-05-08 LAB — TROPONIN I: Troponin I: <0.3 ng/mL (ref <0.30)

## 2011-05-08 LAB — APTT: aPTT: 25 seconds (ref 24–37)

## 2011-05-08 LAB — CK TOTAL AND CKMB (NOT AT ARMC)
CK, MB: 2.3 ng/mL (ref 0.3–4.0)
Relative Index: INVALID (ref 0.0–2.5)
Total CK: 37 U/L (ref 7–177)
Total CK: 35 U/L (ref 7–177)

## 2011-05-08 LAB — CARDIAC PANEL(CRET KIN+CKTOT+MB+TROPI): Total CK: 32 U/L (ref 7–177)

## 2011-05-09 ENCOUNTER — Inpatient Hospital Stay (HOSPITAL_COMMUNITY): Payer: Medicare Other

## 2011-05-09 LAB — CBC
HCT: 35.6 % — ABNORMAL LOW (ref 36.0–46.0)
Hemoglobin: 12.2 g/dL (ref 12.0–15.0)
MCH: 29 pg (ref 26.0–34.0)
MCHC: 34.3 g/dL (ref 30.0–36.0)
RDW: 13.7 % (ref 11.5–15.5)

## 2011-05-09 LAB — BASIC METABOLIC PANEL
BUN: 13 mg/dL (ref 6–23)
Calcium: 9 mg/dL (ref 8.4–10.5)
Creatinine, Ser: 0.91 mg/dL (ref 0.50–1.10)
GFR calc Af Amer: 65 mL/min — ABNORMAL LOW (ref >90)
GFR calc non Af Amer: 56 mL/min — ABNORMAL LOW (ref >90)
Glucose, Bld: 90 mg/dL (ref 70–99)
Potassium: 3.8 mEq/L (ref 3.5–5.1)

## 2011-05-09 LAB — URINALYSIS, ROUTINE W REFLEX MICROSCOPIC
Glucose, UA: NEGATIVE mg/dL
Ketones, ur: NEGATIVE mg/dL
Nitrite: NEGATIVE
Protein, ur: NEGATIVE mg/dL
pH: 7 (ref 5.0–8.0)

## 2011-05-09 LAB — HEMOGLOBIN A1C: Hgb A1c MFr Bld: 5.7 % — ABNORMAL HIGH (ref <5.7)

## 2011-05-09 LAB — LIPID PANEL
HDL: 59 mg/dL (ref >39)
Total CHOL/HDL Ratio: 2.3 RATIO
Triglycerides: 62 mg/dL (ref <150)
VLDL: 12 mg/dL (ref 0–40)

## 2011-05-09 LAB — TSH: TSH: 2.156 u[IU]/mL (ref 0.350–4.500)

## 2011-05-09 LAB — URINE MICROSCOPIC-ADD ON

## 2011-05-09 MED ORDER — GADOBENATE DIMEGLUMINE 529 MG/ML IV SOLN
15.0000 mL | Freq: Once | INTRAVENOUS | Status: AC
  Administered 2011-05-09: 15 mL via INTRAVENOUS

## 2011-05-09 NOTE — Consult Note (Signed)
[name], Kennedy NO.:  [id number]  MEDICAL RECORD NO.:  [id number]  LOCATION:  3003                         FACILITY:  [facility]  PHYSICIAN:  Kipp Laurence, MD DATE OF BIRTH:  09-29-1923  DATE OF CONSULTATION:  05/08/2011 DATE OF DISCHARGE:                                CONSULTATION   CONSULTING PHYSICIAN:  Raeford Razor, MD.  CHIEF COMPLAINT:  Speech changes.  HISTORY OF PRESENT ILLNESS:  Ms. [name] is an 77 year old white woman with history of abdominal aortic aneurysm, coronary artery disease status post stenting around 2000, and left hip fracture status post surgical repair in June 2012, who presents with acute onset today of expressive aphasia.  Her daughter reports that they were attempting to get her into her car at about 12:20 p.m. today.  At that time, she acutely began speaking "nonsense words" and appeared to have trouble standing.  The patient seemed to understand what she was instructed to do at that time and had no focal deficits that her daughter could discern. EMS was called and the patient arrived to the emergency department. Upon arrival here, she had returned back to her baseline and was not felt by her daughter or herself to have any residual deficits.  She has no history of stroke.  She takes 81 mg aspirin daily at home because of her heart history.  Upon arrival here, a CT scan of the head was performed as per stroke protocol and was unremarkable.  Given her return to baseline and negative head CT, code stroke was cancelled and tPA was not given.  PAST MEDICAL HISTORY: 1. Abdominal aortic aneurysm. 2. Coronary artery disease, status post stenting in 2000. 3. History of cervical cancer. 4. Hypertension. 5. Hyperlipidemia.  MEDICATIONS: 1. Metoprolol tartrate 25 mg b.i.d. 2. Pravachol 40 mg nightly. 3. Aspirin 81 mg daily.  ALLERGIES: 1. LIBRIUM. 2. PENICILLIN.  FAMILY HISTORY:  No family history of stroke.  SOCIAL  HISTORY:  The patient lives at home with her daughter.  She has a history of smoking cigarettes and quit in June 2012.  She denies history of alcohol or drug use.  REVIEW OF SYSTEMS:  A complete 10-system review of systems was obtained and is negative except as noted in the HPI.  PHYSICAL EXAMINATION:  VITAL SIGNS:  Temp 97.1 blood pressure 184/84, heart rate 89, respiratory rate 12, O2 saturation 97%. CARDIOVASCULAR:  Regular rate and rhythm.  No apparent murmurs, gallops, or rubs.  No carotid bruits auscultated. PULMONARY:  Clear to auscultation bilaterally. NEURO: Mental status:  The patient is alert and orient x3.  Speech is clear. Language is fluent.  Memory is intact. Cranial nerves: II:  Visual fields intact to confrontation.  III, IV, VI: Extraocular movements intact. Pupils equally round, reactive to light and accommodation.  No nystagmus.  V:  Facial sensation is intact bilaterally.  No weakness in masticatory muscles.  VII:  No facial weakness or asymmetry.  VII:  Auditory acuity is grossly normal bilaterally.  IX, X:  Uvula midline.  Palate elevates symmetrically. XI:  5/5 strength in bilateral sternocleidomastoids and trapezius.  XII: Tongue is midline, is not deviated. Motor exam:  5/5 strength throughout  on resistance testing; however, when I asking her to hold up her left lower extremity, she does have some drift. Sensory exam:  The patient reports a mild decrease in sensation in the right lower extremity compared to the left. Reflexes:  2+ deep tendon reflexes throughout.  Plantar responses are downgoing bilaterally. Coordination:  Intact finger-nose-finger and heel-to-shin bilaterally.  NIH stroke scale score of 2, scoring 1 for right lower extremity sensory change and 1 for left lower extremity drift.  LAB RESULTS:  BMP and CBC are unremarkable.  First set of cardiac enzymes are negative.  Coags are pending.  IMAGING:  CT of head demonstrates moderate atrophy,  but no acute abnormality is noted.  ASSESSMENT:  An 77 year old white woman with a history of coronary artery disease and hypertension, presenting with a 1 hour episode of expressive aphasia, now resolved and back to her baseline.  Concern that this spell could represent a transient ischemic attack or a cerebrovascular accident versus simply an acute episode of delirium.  PLAN: 1. Recommend admission for stroke workup with 24 hour monitoring on     telemetry. 2. MRI brain with and without contrast. 3. Carotid ultrasound and transcranial Dopplers. 4. Transthoracic echocardiogram. 5. Would increase her aspirin to 325 mg daily for stroke prevention. 6. Would gradually work to decrease her systolic blood pressure over     the next few days, would aim for a goal now of 160-180. 7. Recommend risk stratification labs with TSH, hemoglobin A1c, and     lipids. 8. Given the presence of some ventricular hypertrophy on her EKG and     given her heart history, I would recommend cycling two more sets of     cardiac enzymes to rule out.  Thank you very much for this consultation.          ______________________________ Kipp Laurence, MD     ES/MEDQ  D:  05/08/2011  T:  05/09/2011  Job:  [id number]  Electronically Signed by Kipp Laurence MD on 05/09/2011 09:11:37 PM

## 2011-05-10 LAB — URINE CULTURE

## 2011-05-10 NOTE — Discharge Summary (Signed)
NAMEMADELEIN, MAHADEO NO.:  [id number]  MEDICAL RECORD NO.:  [id number]  LOCATION:  3003                         FACILITY:  [facility]  PHYSICIAN:  Isidor Holts, M.D.  DATE OF BIRTH:  27-Sep-1923  DATE OF ADMISSION:  05/08/2011 DATE OF DISCHARGE:  05/09/2011                              DISCHARGE SUMMARY   PRIMARY CARDIOLOGIST:  Gordy Savers, MD  PRIMARY CARDIOLOGIST:  Vesta Mixer, MD  PRIMARY VASCULAR SURGEON:  Fransisco Hertz, MD  PRIMARY ORTHOPEDIC SURGEON:  Toni Arthurs, MD  DISCHARGE DIAGNOSES: 1. Transient ischemic attack, with transient expressive dysphasia. 2. History of coronary artery disease, status post percutaneous     coronary intervention. 3. Hypertension. 4. 3.6-cm abdominal aortic aneurysm. 5. Aortic regurgitation. 6. Dyslipidemia. 7. History of left hip fracture status post IM nail June 2012. 8. History of cervical cancer. 9. Urinary tract infection.  DISCHARGE MEDICATIONS: 1. Amlodipine 10 mg p.o. daily. 2. Ciprofloxacin 500 mg p.o. b.i.d. from May 10, 2011, for 6 days     only. 3. Plavix 75 mg p.o. daily. 4. Metoprolol tartrate 25 mg p.o. b.i.d. 5. Multivitamin therapeutic 1 tablet p.o. at bedtime. 6. Pravachol 40 mg p.o. at bedtime.  Note:  Enteric-coated aspirin has been discontinued.  PROCEDURES: 1. Head CT scan May 08, 2011.  This showed no acute intracranial     abnormality.  There was atrophy with chronic small vessel white     matter ischemic demyelination. 2. Chest x-ray May 08, 2011.  This showed chronic changes.  No     active disease. 3. Brain MRI May 09, 2011.  This showed atrophy and chronic     ischemic changes.  No acute intracranial abnormality. 4. MRA of the brain May 09, 2011.  This showed no significant     intracranial stenosis.  There was mild intracranial atherosclerotic     disease. 5. MRA of the neck May 09, 2011.  This showed 25% diameter     stenosis of the left  internal carotid artery, approximately 50%     diameter stenosis at the origin of the left common carotid artery.     No significant right carotid stenosis, mild atherosclerotic disease     in the vertebral arteries bilaterally.  ADMISSION HISTORY:  As in H and P notes of May 08, 2011, dictated by this MD. However, in brief, this is an 77 year old female, with known history of coronary artery disease status post PCI, history of 3.6 cm abdominal aortic aneurysm per ultrasound scan of December 2011, aortic regurgitation, dyslipidemia, ex-smoker, quit in June 2012 after 50 years of smoking, history of fall/left hip fracture June 2012 status post IM nail, history of cervical cancer, hypertension, presenting with transient altered mental status, described as expressive dysphasia as well as dysarthria, occurring about 12:30 p.m. on May 08, 2011, while the patient was sitting in a chair watching television.  She was brought to the emergency department.  Code stroke was called.  The patient was evaluated by Dr. Lendell Caprice, neurologist, was not deemed to be a candidate for tPA, as symptoms were already improving.  She was therefore admitted for further evaluation, investigation, and management.  CLINICAL COURSE:  1. TIA.  The patient presented as described above.  For details of     imaging studies, refer to procedure list above.  There were no     acute intracranial findings or significant proximal vessel     stenosis.  As of May 09, 2011, symptoms had completely     resolved.  The patient was evaluated by PT/OT and no acute needs     identified.  She has been placed on Plavix, in place of her     preadmission aspirin.  2. History of coronary artery disease.  The patient was asymptomatic     from this viewpoint.  Cardiac enzymes remained unelevated.  3. Dyslipidemia.  The patient's lipid profile was as follows; total     cholesterol 166, triglycerides 62, HDL 59, and LDL 65, i.e.      excellent lipid profile.  She has been reassured accordingly.  4. Hypertension.  The patient's blood pressure was moderately elevated     during the course of this hospitalization.  She continues on     preadmission beta-blocker without change; however, Norvasc 10 mg     p.o. daily has been added to her antihypertensive medication.  She     will follow up with her PMD for further adjustment of     antihypertensives, if indicated.  5. History of abdominal aortic aneurysm.  The patient follows up with     her primary vascular surgeon, Dr. Leonides Sake for this.  6. History of left hip IM nail.  There are no problems referable to     this.  7. Urinary tract infection.  The patient's urinalysis demonstrated     white cells too numerous to count and many bacteria.  She was     therefore placed on a 7-day course of ciprofloxacin, to be completed     as an outpatient.  DISPOSITION:  The patient was asymptomatic on May 09, 2011.  There were no new issues.  She was considered clinically stable for discharge and discharged accordingly.  ACTIVITY:  As tolerated.  DIET:  Heart healthy.  FOLLOWUP INSTRUCTIONS:  The patient will follow up with her primary MD, Dr. Eleonore Chiquito in about 1 week.  She has been instructed to call for an appointment.  She will otherwise follow up routinely with her primary cardiologist, Dr. Kristeen Miss, primary vascular surgeon, Dr. Leonides Sake, and primary orthopedic surgeon, Dr. Toni Arthurs.  All of this has been communicated to the patient and her daughter, they verbalized understanding.     Isidor Holts, M.D.     CO/MEDQ  D:  05/09/2011  T:  05/10/2011  Job:  [id number]  cc:   Gordy Savers, MD Vesta Mixer, M.D. Fransisco Hertz, MD Toni Arthurs, MD  Electronically Signed by Isidor Holts M.D. on 05/10/2011 06:51:43 PM

## 2011-05-10 NOTE — H&P (Signed)
[name], Kennedy NO.:  [id number]  MEDICAL RECORD NO.:  [id number]  LOCATION:  3003                         FACILITY:  [facility]  PHYSICIAN:  Isidor Holts, M.D.  DATE OF BIRTH:  [birth year]  DATE OF ADMISSION:  05/08/2011 DATE OF DISCHARGE:                             HISTORY & PHYSICAL   PRIMARY CARE PHYSICIAN:  Gordy Savers, MD.  PRIMARY CARDIOLOGIST:  Vesta Mixer, MD.  PRIMARY VASCULAR SURGEON:  Fransisco Hertz, MD.  PRIMARY ORTHOPEDIC SURGEON:  Toni Arthurs, MD.  CHIEF COMPLAINT:  Altered mental status/dysarthria and dysphagia today.  HISTORY OF PRESENT ILLNESS:  This is an 77 year old female.  For past medical history, see below.  According to the history obtained from the ED MD, and from the patient herself, who is an excellent historian, she was at home with her daughter at about 12:30 p.m. on May 08, 2011, sitting in the chair watching TV, when she suddenly found herself unable to express herself.  She has tried to talk to her daughter, but initially she was unable to speak and then the words came out all wrong. Apparently, the patient was also slightly confused at the time.  She denies any limb weakness; however.  Her daughter brought her to the emergency department.  Code stroke was called.  The patient was initially evaluated by Dr. Lendell Caprice, neurologist, but deemed not to be a candidate for tPA, as symptoms were already improving.  She was therefore referred to the medical service for admission, for further evaluation, investigation, and management.  PAST MEDICAL HISTORY: 1. Coronary artery disease, status post PCI. 2. History of abdominal aortic aneurysm (3.6 cm on aortic ultrasound     scan of July 02, 2010). 3. Aortic regurgitation. 4. Dyslipidemia. 5. Ex-smoker, quit in June 2012, after 50 years of smoking. 6. History of fall/left hip fracture June 2012, status post IM nail. 7. History of cervical cancer. 8.  Hypertension.  ALLERGIES:  FOSAMAX AND LIBRIUM.  MEDICATION HISTORY: 1. Enteric-coated aspirin 81 mg p.o. daily. 2. Metoprolol tartrate 25 mg p.o. b.i.d. 3. Pravachol 40 mg p.o. at bedtime.  REVIEW OF SYSTEMS:  As per HPI and chief complaint, the patient denies abdominal pain, vomiting, or diarrhea.  Denies chest pain or shortness of breath.  Denies fever or chills.  Denies cough.  The rest of systems review is negative.  SOCIAL HISTORY:  The patient is retired.  She used to work in catering. She has been widowed since 1975.  Ex-smoker.  Used to use to smoke about half a pack of cigarettes per day for over 50 years, but quit in June 2012.  Nondrinker.  Has no history of drug abuse.  Has 3 offspring; 2 sons and 1 daughter.  Her daughter currently lives with her.  The patient apparently also has a caregiver.  FAMILY HISTORY:  Both parents are deceased; the patient's mother at age 69, and her father at age 67 years, from unknown causes.  PHYSICAL EXAMINATION:  VITAL SIGNS:  Temperature 98.3, pulse 86 per minute regular, respiratory rate 16, BP 150/65 mmHg, and pulse oximeter 95% on room air. GENERAL:  The patient did not appear to be in  obvious acute distress at the time of this evaluation, alert, communicative, and fully oriented. Not short of breath at rest, not dysarthric or dysphasic. HEENT:  No clinical pallor or jaundice.  No conjunctival injection. Visible mucous membranes appear mildly "dry". NECK:  Supple.  JVP not seen.  No palpable lymphadenopathy.  No palpable goiter. CHEST:  Clinically clear to auscultation.  No wheezes.  No crackles. CARDIOVASCULAR:  Heart sounds 1 and 2 heard.  Normal and regular.  No murmurs. ABDOMEN:  Flat, soft, and nontender.  No palpable organomegaly.  No palpable masses.  Normal bowel sounds.  Lower extremity examination, no pitting edema.  Palpable peripheral pulses. MUSCULOSKELETAL SYSTEM:  Generalized osteoarthritic changes  otherwise unremarkable. CENTRAL NERVOUS SYSTEM:  The patient currently has no focal neurologic deficit on gross examination.  INVESTIGATIONS:  CBC, WBC 8.1, hemoglobin 13.3, hematocrit 39.0, and platelets 220.  Electrolytes sodium 136, potassium 4.2, chloride 103, CO2 24, BUN 17, creatinine 1.10, and glucose 112.  LFTs are normal. Troponin I less than 0.30.  Head CT scan on May 08, 2011, shows no acute intracranial findings.  There was atrophy and chronic small vessel white matter disease.  ASSESSMENT AND PLAN: 1. Transient ischemic attack versus cerebrovascular accident.  I tend     to favor the former, as the patient's symptoms have practically     resolved and currently she has no elicitable focal neurologic     deficit on gross examination.  We shall admit for telemetric     monitoring; however, do neuro checks per protocol, engage in risk     factor modification, increase the patient's aspirin to 325 mg p.o.     daily.  The patient passed bedside swallow screen, therefore we shall     commence her on heart healthy diet, but will place her on low-dose     maintenance intravenous fluids, because of a mildly elevated     BUN/creatinine ratio.  2. Hypertension.  BP is mildly elevated at 150/65 mmHg.  Although this     appears reasonable, the patient was preadmission on beta-blockade.     We shall continue this and observe.  Should blood pressure start     creeping up beyond acceptable levels, we shall address accordingly.  3. Dyslipidemia.  The patient is currently on a statin, which we     shall continue; however, check TSH and fasting lipid profile.  4. History of coronary artery disease.  The patient appears     asymptomatic; however, we shall get a 12-lead EKG and cycle cardiac     enzymes.  For completeness, we shall do urinalysis.    Further management will depend on clinical course.     Isidor Holts, M.D.     CO/MEDQ  D:  05/08/2011  T:  05/08/2011  Job:   [id number]  cc:   Gordy Savers, MD Vesta Mixer, M.D. Fransisco Hertz, MD Toni Arthurs, MD  Electronically Signed by Isidor Holts M.D. on 05/10/2011 06:48:55 PM

## 2011-05-12 ENCOUNTER — Encounter: Payer: Self-pay | Admitting: *Deleted

## 2011-05-12 DIAGNOSIS — I351 Nonrheumatic aortic (valve) insufficiency: Secondary | ICD-10-CM | POA: Insufficient documentation

## 2011-05-13 ENCOUNTER — Encounter: Payer: Self-pay | Admitting: Cardiovascular Disease

## 2011-05-13 ENCOUNTER — Emergency Department (HOSPITAL_COMMUNITY): Payer: Medicare Other

## 2011-05-13 ENCOUNTER — Other Ambulatory Visit: Payer: Medicare Other | Admitting: *Deleted

## 2011-05-13 ENCOUNTER — Inpatient Hospital Stay (HOSPITAL_COMMUNITY)
Admission: EM | Admit: 2011-05-13 | Discharge: 2011-05-14 | DRG: 312 | Disposition: A | Discharge status: Home / Self Care | Payer: Medicare Other | Attending: Family Medicine | Admitting: Family Medicine

## 2011-05-13 ENCOUNTER — Ambulatory Visit (INDEPENDENT_AMBULATORY_CARE_PROVIDER_SITE_OTHER): Payer: Medicare Other | Admitting: Cardiovascular Disease

## 2011-05-13 DIAGNOSIS — I714 Abdominal aortic aneurysm, without rupture: Secondary | ICD-10-CM

## 2011-05-13 DIAGNOSIS — Z8673 Personal history of transient ischemic attack (TIA), and cerebral infarction without residual deficits: Secondary | ICD-10-CM

## 2011-05-13 DIAGNOSIS — I9589 Other hypotension: Principal | ICD-10-CM | POA: Diagnosis present

## 2011-05-13 DIAGNOSIS — R0989 Other specified symptoms and signs involving the circulatory and respiratory systems: Secondary | ICD-10-CM

## 2011-05-13 DIAGNOSIS — R5381 Other malaise: Secondary | ICD-10-CM | POA: Diagnosis present

## 2011-05-13 DIAGNOSIS — Z8541 Personal history of malignant neoplasm of cervix uteri: Secondary | ICD-10-CM

## 2011-05-13 DIAGNOSIS — G459 Transient cerebral ischemic attack, unspecified: Secondary | ICD-10-CM | POA: Insufficient documentation

## 2011-05-13 DIAGNOSIS — I2510 Atherosclerotic heart disease of native coronary artery without angina pectoris: Secondary | ICD-10-CM

## 2011-05-13 DIAGNOSIS — T448X5A Adverse effect of centrally-acting and adrenergic-neuron-blocking agents, initial encounter: Secondary | ICD-10-CM | POA: Diagnosis present

## 2011-05-13 LAB — LIPID PANEL
HDL: 61.7 mg/dL (ref >39.00)
Total CHOL/HDL Ratio: 2
Triglycerides: 59 mg/dL (ref 0.0–149.0)
VLDL: 11.8 mg/dL (ref 0.0–40.0)

## 2011-05-13 LAB — POCT I-STAT, CHEM 8
BUN: 23 mg/dL (ref 6–23)
Calcium, Ion: 1.27 mmol/L (ref 1.12–1.32)
Chloride: 104 mEq/L (ref 96–112)
Creatinine, Ser: 1.3 mg/dL — ABNORMAL HIGH (ref 0.50–1.10)
Glucose, Bld: 109 mg/dL — ABNORMAL HIGH (ref 70–99)
HCT: 34 % — ABNORMAL LOW (ref 36.0–46.0)
Hemoglobin: 11.6 g/dL — ABNORMAL LOW (ref 12.0–15.0)
Potassium: 3.8 mEq/L (ref 3.5–5.1)
Sodium: 136 mEq/L (ref 135–145)
TCO2: 22 mmol/L (ref 0–100)

## 2011-05-13 LAB — BASIC METABOLIC PANEL
Calcium: 9 mg/dL (ref 8.4–10.5)
Creatinine, Ser: 1.1 mg/dL (ref 0.4–1.2)
GFR: 51.76 mL/min — ABNORMAL LOW (ref >60.00)
Sodium: 138 mEq/L (ref 135–145)

## 2011-05-13 LAB — HEPATIC FUNCTION PANEL
Alkaline Phosphatase: 70 U/L (ref 39–117)
Bilirubin, Direct: 0.1 mg/dL (ref 0.0–0.3)
Total Bilirubin: 0.5 mg/dL (ref 0.3–1.2)

## 2011-05-13 MED ORDER — AMLODIPINE BESYLATE 10 MG PO TABS: 10.0000 mg | ORAL_TABLET | Freq: Every day | ORAL | Status: DC

## 2011-05-13 MED ORDER — CLOPIDOGREL BISULFATE 75 MG PO TABS: 75.0000 mg | ORAL_TABLET | Freq: Every day | ORAL | Status: DC

## 2011-05-13 MED ORDER — CARVEDILOL 25 MG PO TABS: 25.0000 mg | ORAL_TABLET | Freq: Two times a day (BID) | ORAL | Status: DC

## 2011-05-13 NOTE — Patient Instructions (Addendum)
Your physician has requested that you have an abdominal aorta duplex. During this test, an ultrasound is used to evaluate the aorta. Allow 30 minutes for this exam. Do not eat after midnight the day before and avoid carbonated beverages  Your physician has recommended you make the following change in your medication:   1) stop metoprolol  2) start coreg 25 mg one tablet twice a day 12 hours apart.   Your physician wants you to follow-up in: 3 months, You will receive a reminder letter in the mail two months in advance. If you don't receive a letter, please call our office to schedule the follow-up appointment.

## 2011-05-13 NOTE — Assessment & Plan Note (Signed)
Stable.  No angina  

## 2011-05-13 NOTE — Progress Notes (Signed)
[name] Date of Birth  [birth year] [facility] [address]    [address] [place], [place]  [address] [phone]  Fax  [phone]  History of Present Illness:  A 77-year-old female with a history of coronary artery disease. She status post PTCA and stenting of her right coronary artery.  She's had some problems with high blood pressure. She was recently admitted to the hospital with a TIA. She had Plavix and Norvasc 10 mg a day added to her medical regimen.  She's recovered fairly well from a neurologic standpoint. Her blood pressure has remained mildly elevated .  She quit smoking in June.   Current Outpatient Prescriptions  Medication Sig Dispense Refill  . amLODipine (NORVASC) 10 MG tablet Take 10 mg by mouth daily.        . ciprofloxacin (CIPRO) 500 MG tablet Take 500 mg by mouth 2 (two) times daily.        . clopidogrel (PLAVIX) 75 MG tablet Take 75 mg by mouth daily.        . IBUPROFEN PO Take by mouth as needed.        . metoprolol (TOPROL-XL) 50 MG 24 hr tablet Take 25 mg by mouth 2 (two) times daily.        . Multiple Vitamin (MULTIVITAMIN PO) Take by mouth daily.        . nitroGLYCERIN (NITROSTAT) 0.4 MG SL tablet Place 0.4 mg under the tongue every 5 (five) minutes as needed.        . pravastatin (PRAVACHOL) 40 MG tablet Take 1 tablet (40 mg total) by mouth daily.  90 tablet  4     Allergies  Allergen Reactions  . Alendronate Sodium   . Chlordiazepoxide   . Librium   . Zocor (Simvastatin)     Leg cramps    Past Medical History  Diagnosis Date  . DJD (degenerative joint disease)   . CAD (coronary artery disease)   . Hypertension   . Hyperlipidemia   . Abdominal aortic aneurysm   . Osteopenia   . Aortic insufficiency   . Broken hip     left    Past Surgical History  Procedure Date  . Cataract extraction   . Abdominal hysterectomy   . Cholecystectomy   . Tonsillectomy   . Ptca   . Appendectomy   . Cardiac catheterization 2000   PTCA,and stenting to RCA    History  Smoking status  . Former Smoker  . Quit date: 01/09/2011  Smokeless tobacco  . Not on file    History  Alcohol Use     Family History  Problem Relation Age of Onset  . Coronary artery disease Mother   . Dementia Sister   . Coronary artery disease Brother   . Cancer Sister     Reviw of Systems:  Reviewed in the HPI.  All other systems are negative.  Physical Exam: BP 154/71  Pulse 70  Ht 5\' 4"  (1.626 m)  Wt 105 lb 12.8 oz (47.991 kg)  BMI 18.16 kg/m2 The patient is alert and oriented x 3.  The mood and affect are normal.   Skin: warm and dry.  Color is normal.    HEENT:   2+ carotids, no JVD  Lungs: clear   Heart: RR, soft 1-2/6 systolic murmur    Abdomen: large pulsitile mass, + bruit  Extremities:  No edema  Neuro:  nonfocal     ECG:  Assessment / Plan:

## 2011-05-13 NOTE — Assessment & Plan Note (Signed)
She is very thin. She has a small to medium sized abdominal aortic aneurysm although feels a little bit more pronounced today. In addition it is associated with a bruit. We will get a repeat arterial duplex scan for further evaluation of her aneurysm. She's been seen by [facility] and vascular specialist in the past.

## 2011-05-13 NOTE — Assessment & Plan Note (Signed)
She had a TIA this past week. The aspirin was stopped and she was started on Plavix. I agree with this change and we will continue with the Plavix.

## 2011-05-13 NOTE — Assessment & Plan Note (Signed)
Her blood pressure remained mildly elevated. We'll discontinue her metoprolol and start her on carvedilol 25 mg twice a day. We'll see her back in 3 months for blood pressure check an office visit.

## 2011-05-14 LAB — DIFFERENTIAL
Basophils Absolute: 0.1 10*3/uL (ref 0.0–0.1)
Basophils Relative: 1 % (ref 0–1)
Eosinophils Absolute: 0.6 10*3/uL (ref 0.0–0.7)
Monocytes Absolute: 0.7 10*3/uL (ref 0.1–1.0)
Monocytes Relative: 8 % (ref 3–12)
Neutro Abs: 6.2 10*3/uL (ref 1.7–7.7)
Neutrophils Relative %: 74 % (ref 43–77)

## 2011-05-14 LAB — URINALYSIS, ROUTINE W REFLEX MICROSCOPIC
Glucose, UA: NEGATIVE mg/dL
Hgb urine dipstick: NEGATIVE
Ketones, ur: NEGATIVE mg/dL
Nitrite: NEGATIVE
Specific Gravity, Urine: 1.028 (ref 1.005–1.030)
pH: 5.5 (ref 5.0–8.0)

## 2011-05-14 LAB — COMPREHENSIVE METABOLIC PANEL
ALT: 8 U/L (ref 0–35)
AST: 13 U/L (ref 0–37)
Albumin: 3.3 g/dL — ABNORMAL LOW (ref 3.5–5.2)
Alkaline Phosphatase: 77 U/L (ref 39–117)
Calcium: 9.8 mg/dL (ref 8.4–10.5)
Glucose, Bld: 108 mg/dL — ABNORMAL HIGH (ref 70–99)
Potassium: 3.7 mEq/L (ref 3.5–5.1)
Sodium: 134 mEq/L — ABNORMAL LOW (ref 135–145)
Total Protein: 6.4 g/dL (ref 6.0–8.3)

## 2011-05-14 LAB — CBC
Hemoglobin: 11.6 g/dL — ABNORMAL LOW (ref 12.0–15.0)
MCH: 28.7 pg (ref 26.0–34.0)
MCHC: 33.6 g/dL (ref 30.0–36.0)
Platelets: 201 10*3/uL (ref 150–400)

## 2011-05-14 LAB — URINE MICROSCOPIC-ADD ON

## 2011-05-14 LAB — POCT I-STAT TROPONIN I: Troponin i, poc: 0.01 ng/mL (ref 0.00–0.08)

## 2011-05-14 NOTE — H&P (Signed)
[name], [name] NO.:  [id number]  MEDICAL RECORD NO.:  [id number]  LOCATION:  WLED                         FACILITY:  [facility]  PHYSICIAN:  Della Goo, M.D. DATE OF BIRTH:  1924-06-08  DATE OF ADMISSION:  05/13/2011 DATE OF DISCHARGE:                             HISTORY & PHYSICAL   DATE OF ADMISSION:  May 14, 2011.  PRIMARY CARE PHYSICIAN:  Gordy Savers, MD  CHIEF COMPLAINT:  Weakness.  HISTORY OF PRESENT ILLNESS:  This is an 77 year old female who was brought to the emergency department for further evaluation secondary to generalized weakness.  Patient had been using the bedside commode and was unable to get up.  EMS was called and on arrival they evaluated the patient and her blood pressure had been found at that time to be 70/40. Patient did not have any complaints of chest pain.  She also had no complaints of syncope.  Patient recently had a workup for a TIA and saw Cardiology.  Her medications were changed and she had been placed on Coreg therapy.  She took the medications last evening as prescribed. EMS began to administer IV fluids, and on arrival to the emergency department, patient had been found to have a blood pressure of 126 systolic.  Her heart rate however was found to be 56, and in the emergency department, patient's heart rate ranged from the 50s to the 60s.  An EKG was also performed in the emergency department and patient was found to have a first-degree AV block.  Patient was referred for medical admission.  PAST MEDICAL HISTORY:  Significant for: 1. Coronary artery disease. 2. Hypertension. 3. Hyperlipidemia. 4. History of abdominal aortic aneurysm. 5. Remote history of cervical cancer.  MEDICATIONS:  At this time include Amlodipine, ciprofloxacin, Plavix, multivitamin, Coreg, and Pravachol.  ALLERGIES: 1. LIBRIUM. 2. PENICILLIN.  SOCIAL HISTORY:  Patient is a former smoker.  She reports  occasionally drinking alcohol and she denies any illicit drug usage.  FAMILY HISTORY:  Noncontributory.  REVIEW OF SYSTEMS:  Pertinent as mentioned above.  PHYSICAL EXAMINATION FINDINGS:  GENERAL:  This is an elderly thin, well- developed 76 year old Caucasian female who is in no acute distress. VITAL SIGNS:  Temperature 97.8, blood pressures ranging from 126 systolic to 136 systolic and the diastolic range was 51 to 85, heart rate has ranged from 56 to 62, respirations 16 to 23, O2 sats 97 to 100%. HEENT:  Normocephalic, atraumatic.  Pupils are equally round and reactive to light.  Extra movements are intact.  Funduscopic benign. There is no scleral icterus.  Nares are patent bilaterally.  Oropharynx is clear. NECK:  Supple.  Full range of motion.  No thyromegaly, adenopathy, jugular venous distention. CARDIOVASCULAR:  Regular rate and rhythm.  No murmurs, gallops, rubs appreciated.  LUNGS:  Clear to auscultation bilaterally.  Chest wall nontender.  Breathing is unlabored and chest wall excursion is symmetric. ABDOMEN:  Positive bowel sounds.  Soft, nontender, nondistended.  No hepatosplenomegaly. EXTREMITIES:  Without cyanosis, clubbing, or edema. NEUROLOGIC:  Patient is alert, but mildly confused.  She has generalized weakness, but otherwise there are no focal deficits.  LABORATORY STUDIES:  White blood cell count  8.4, hemoglobin 11.6, hematocrit 34.5, MCV 85.4, platelets 201, neutrophils 74%, lymphocytes 10%.  Sodium 134, potassium 3.7, chloride 101, carbon dioxide 26, BUN 21, creatinine 1.17, and glucose 108.  Urinalysis negative except urine total protein 30.  Chest x-ray reveals vascular congestion without significant pulmonary edema.  EKG reveals a sinus rhythm with a first-degree AV block.  No acute ST-segment changes are seen however.  Troponin 0.01.  ASSESSMENT:  77 year old female being admitted with: 1. Hypotension. 2. Bradycardia. 3. Generalized weakness  secondary to #1 and #2. 4. Coronary artery disease. 5. Hypertension. 6. Hyperlipidemia. 7. First-degree AV block.  PLAN:  Patient will be admitted to telemetry area for monitoring. Cardiac enzymes will be performed and orthostatic vital signs will also be performed.  The orthostatic vital signs that were performed in the emergency department were found to be normal.  However of note, patient had received IV fluids prior to this.  Patient's hypotension and  bradycardia may be secondary to her recent med change to the Coreg 25mg  p.o. b.i.d. and also patient has a first-degree AV block.  The patient's regular medications will be further reconciled and DVT prophylaxis will  be ordered.     Della Goo, M.D.     HJ/MEDQ  D:  05/14/2011  T:  05/14/2011  Job:  [id number]  cc:   Gordy Savers, MD [address] [place] [address]  Electronically Signed by Della Goo M.D. on 05/14/2011 08:02:03 PM

## 2011-05-17 ENCOUNTER — Ambulatory Visit (INDEPENDENT_AMBULATORY_CARE_PROVIDER_SITE_OTHER): Payer: Medicare Other | Admitting: Internal Medicine

## 2011-05-17 ENCOUNTER — Encounter: Payer: Self-pay | Admitting: Internal Medicine

## 2011-05-17 DIAGNOSIS — I10 Essential (primary) hypertension: Secondary | ICD-10-CM

## 2011-05-17 DIAGNOSIS — I2510 Atherosclerotic heart disease of native coronary artery without angina pectoris: Secondary | ICD-10-CM

## 2011-05-17 DIAGNOSIS — E785 Hyperlipidemia, unspecified: Secondary | ICD-10-CM

## 2011-05-17 NOTE — Progress Notes (Signed)
  Subjective:    Patient ID: [name], female    DOB: [birth year], 77 y.o.   MRN: [id number]  HPI  77 year old patient who was seen today post hospital discharge. She was admitted for observation following orthostatic hypotension related to initiation of Coreg. This medication was discontinued and she was resumed on metoprolol twice daily and she has done well. Denies any shortness of breath weakness dizziness or chest pain. She is accompanied by her daughter who feels she also has done well. Her only complaint today is chronic insomnia. Denies any exertional chest pain. She has a history of dyslipidemia which has been controlled on pravastatin. No nitroglycerin use. Hospital records reviewed    Review of Systems  Constitutional: Negative.   HENT: Negative for hearing loss, congestion, sore throat, rhinorrhea, dental problem, sinus pressure and tinnitus.   Eyes: Negative for pain, discharge and visual disturbance.  Respiratory: Negative for cough and shortness of breath.   Cardiovascular: Negative for chest pain, palpitations and leg swelling.  Gastrointestinal: Negative for nausea, vomiting, abdominal pain, diarrhea, constipation, blood in stool and abdominal distention.  Genitourinary: Negative for dysuria, urgency, frequency, hematuria, flank pain, vaginal bleeding, vaginal discharge, difficulty urinating, vaginal pain and pelvic pain.  Musculoskeletal: Negative for joint swelling, arthralgias and gait problem.  Skin: Negative for rash.  Neurological: Negative for dizziness, syncope, speech difficulty, weakness, numbness and headaches.  Hematological: Negative for adenopathy.  Psychiatric/Behavioral: Positive for sleep disturbance. Negative for behavioral problems, dysphoric mood and agitation. The patient is not nervous/anxious.        Objective:   Physical Exam  Constitutional: She is oriented to person, place, and time. She appears well-developed and well-nourished.       Blood  pressure 140/60 in both arms  HENT:  Head: Normocephalic.  Right Ear: External ear normal.  Left Ear: External ear normal.  Mouth/Throat: Oropharynx is clear and moist.  Eyes: Conjunctivae and EOM are normal. Pupils are equal, round, and reactive to light.  Neck: Normal range of motion. Neck supple. No thyromegaly present.  Cardiovascular: Normal rate, regular rhythm, normal heart sounds and intact distal pulses.   Pulmonary/Chest: Effort normal.       A few crackles right base  Abdominal: Soft. Bowel sounds are normal. She exhibits no mass. There is no tenderness.  Musculoskeletal: Normal range of motion.  Lymphadenopathy:    She has no cervical adenopathy.  Neurological: She is alert and oriented to person, place, and time.  Skin: Skin is warm and dry. No rash noted.  Psychiatric: She has a normal mood and affect. Her behavior is normal.          Assessment & Plan:   Hypertension well controlled Coronary artery disease stable History of orthostatic hypotension. Coreg discontinued Dyslipidemia we'll continue pravastatin  Recheck in 6 months or as needed

## 2011-05-17 NOTE — Patient Instructions (Signed)
Limit your sodium (Salt) intake    It is important that you exercise regularly, at least 20 minutes 3 to 4 times per week.  If you develop chest pain or shortness of breath seek  medical attention.  Return in 6 months for follow-up  

## 2011-05-20 NOTE — Discharge Summary (Signed)
NAMEMarland Kitchen  [name], [name] NO.:  [id number]  MEDICAL RECORD NO.:  [id number]  LOCATION:  1445                         FACILITY:  [facility]  PHYSICIAN:  Brendia Sacks, MD    DATE OF BIRTH:  10-13-1923  DATE OF ADMISSION:  05/13/2011 DATE OF DISCHARGE:  05/14/2011                              DISCHARGE SUMMARY   PRIMARY CARE PHYSICIAN:  Gordy Savers, MD  PRIMARY CARDIOLOGIST:  Vesta Mixer, M.D.  CONDITION ON DISCHARGE:  Improved.  DISPOSITION:  Home.  DISCHARGE DIAGNOSES: 1. Hypotension, generalized weakness secondary to Coreg. 2. History of coronary artery disease, hypertension, and transient     ischemic attack.  HISTORY OF PRESENT ILLNESS:  [name] is an 77 year old woman, who presented with hypotension and bradycardia after taking a dose of Coreg.  HOSPITAL COURSE:  [name] was admitted to the medical floor and monitored on telemetry.  She responded to IV fluids in the emergency room and she has had no further hypotension.  She has been able to stand up with minimal assistance and feels much better.  Lightheadedness has resolved.  Telemetry was unremarkable.  Review of her EKG showed no changes from earlier this month.  Her daughter confirms to me that the patient took only Coreg and not a combination of Coreg and metoprolol. The patient had just seen Dr. Elease Hashimoto in the office yesterday at which time, metoprolol was changed to Coreg to achieve better blood pressure control.  I discussed the case with Dr. Elease Hashimoto today, who recommended resuming the patient's usual metoprolol dosing and discontinuing Coreg. I have discussed this plan with the daughter.  CONSULTATIONS:  None.  PROCEDURES:  None.  IMAGING:  Chest x-ray on October 20: Vascular congestion noted.  No significant pulmonary edema.  PERTINENT LABORATORY STUDIES: 1. CBC essentially unremarkable. 2. Basic metabolic panel unremarkable. 3. Troponin was negative.  PHYSICAL  EXAMINATION ON DISCHARGE:  GENERAL:  The patient appears well, in no acute distress. VITAL SIGNS: Temp is 97.6, pulse 61, respirations 18, blood pressure 151/66, saturation 98% on room air. CARDIOVASCULAR:  Bradycardic.  Regular rhythm.  No murmur, rub, or gallop.  RESPIRATORY:  Clear to auscultation bilaterally.  No wheezes, rales, or rhonchi.  Normal respiratory effort.  Note bradycardia is resolving. NEUROLOGIC:  Nonfocal.  Cranial nerves II-XII are intact.  Speech is fluent and clear.  Tone and strength in the upper and lower extremities appears to be grossly normal.  Able to stand with minimal assistance.  DISCHARGE INSTRUCTIONS:  The patient will be discharged home today.  DIET:  Low-sodium heart-healthy diet.  ACTIVITY:  Continue to use walker or cane.  FOLLOW UP:  With Dr. Elease Hashimoto in approximately 2 weeks and Dr. Amador Cunas in 2-4 weeks.  DISCHARGE MEDICATIONS:  New: Metoprolol 25 mg p.o. b.i.d.  RESUME THE FOLLOWING MEDICATIONS: 1. Amlodipine 10 mg p.o. q.h.s. 2. Artificial Tears 2 drops in eyes daily as needed. 3. Cipro 500 mg p.o. b.i.d. 4. Clopidogrel 75 mg p.o. q.h.s. 5. Multivitamin p.o. q.h.s. 6. Pravachol 40 mg p.o. q.h.s. Discontinue Coreg.  Time coordinating discharge 35 minutes.     Brendia Sacks, MD     DG/MEDQ  D:  05/14/2011  T:  05/14/2011  Job:  [id number]  cc:   Gordy Savers, MD [address] [place] [address]  Vesta Mixer, M.D. Fax: [phone]  Electronically Signed by Brendia Sacks  on 05/20/2011 02:46:08 PM

## 2011-06-06 ENCOUNTER — Encounter (INDEPENDENT_AMBULATORY_CARE_PROVIDER_SITE_OTHER): Payer: Medicare Other | Admitting: Cardiology

## 2011-06-06 ENCOUNTER — Other Ambulatory Visit (HOSPITAL_COMMUNITY): Payer: Medicare Other | Admitting: Radiology

## 2011-06-06 ENCOUNTER — Encounter: Payer: Medicare Other | Admitting: Cardiology

## 2011-06-06 DIAGNOSIS — I714 Abdominal aortic aneurysm, without rupture, unspecified: Secondary | ICD-10-CM

## 2011-06-06 DIAGNOSIS — I2510 Atherosclerotic heart disease of native coronary artery without angina pectoris: Secondary | ICD-10-CM

## 2011-06-06 DIAGNOSIS — I700 Atherosclerosis of aorta: Secondary | ICD-10-CM

## 2011-06-06 DIAGNOSIS — R0989 Other specified symptoms and signs involving the circulatory and respiratory systems: Secondary | ICD-10-CM

## 2011-06-07 ENCOUNTER — Telehealth: Payer: Self-pay | Admitting: *Deleted

## 2011-06-07 DIAGNOSIS — I719 Aortic aneurysm of unspecified site, without rupture: Secondary | ICD-10-CM

## 2011-06-07 NOTE — Telephone Encounter (Signed)
Needs referral to [facility], sent to [facility] pool,

## 2011-06-07 NOTE — Telephone Encounter (Signed)
Informer her we will be calling with referral to [facility], pt ok and aware she has increased dimensions with her AA.

## 2011-06-09 ENCOUNTER — Encounter: Payer: Self-pay | Admitting: Vascular Surgery

## 2011-06-10 ENCOUNTER — Ambulatory Visit (INDEPENDENT_AMBULATORY_CARE_PROVIDER_SITE_OTHER): Payer: Medicare Other | Admitting: Vascular Surgery

## 2011-06-10 ENCOUNTER — Encounter: Payer: Self-pay | Admitting: Vascular Surgery

## 2011-06-10 VITALS — BP 150/60 | HR 82 | Temp 98.1°F | Ht 64.0 in | Wt 108.0 lb

## 2011-06-10 DIAGNOSIS — I714 Abdominal aortic aneurysm, without rupture: Secondary | ICD-10-CM

## 2011-06-10 NOTE — Progress Notes (Signed)
[facility] [place]  Established Abdominal Aortic Aneurysm  History of Present Illness  The patient is a 77 y.o. female who presents with chief complaint: follow up for AAA.  Previous studies demonstrate an AAA, measuring 3.8 cm x 3.7 cm.  The patient does not have back or abdominal pain.  The patient is a former smoker.  She recently had an AAA duplex which demonstrated increase in size to: 4.4 cm x 4.6 cm.  The patient's PMH, PSH, SH, FamHx, Med, Allergies and ROS are unchanged from 07/02/10.  Physical Examination  Filed Vitals:   06/10/11 1454  BP: 150/60  Pulse: 82  Temp: 98.1 F (36.7 C)  TempSrc: Oral  Height: 5\' 4"  (1.626 m)  Weight: 108 lb (48.988 kg)  SpO2: 97%   Body mass index is 18.54 kg/(m^2).  General: A&O x 3, WDWN, cachectic  Pulmonary: Sym exp, good air movt, CTAB, no rales, rhonchi, & wheezing  Cardiac: RRR, Nl S1, S2, no Murmurs, rubs or gallops  Vascular: Vessel Right Left  Radial Palpable Palpable  Brachial Palpable Palpable  Carotid Palpable, without bruit Palpable, without bruit  Aorta Prominently Palpable N/A  Femoral Palpable Palpable  Popliteal Palpable Palpable  PT Palpable Palpable  DP Palpable Palpable   Gastrointestinal: soft, NTND, -G/R, - HSM, - masses, - CVAT B  Musculoskeletal: M/S 5/5 throughout , Extremities without ischemic changes   Neurologic: Pain and light touch intact in extremities , Motor exam as listed above  Medical Decision Making  The patient is a 77 y.o. female who presents with: possibly enlarging AAA.   Based on this patient's exam and diagnostic studies, she needs CTA abd/pelvis.    Given the almost 1 cm growth in AAA size, I would verify the size with the gold standard study.    It is possible the change in size may be due to change in vascular lab, but technically the study is relatively straight forward so I suspect there is definite growth given this patient's heavy smoking  history.  The threshold for repair is AAA size > 5.5 cm, growth > 1 cm/yr, and symptomatic status.  CTA is ordered for Monday.  The patient will hydrate throughout the weekend and after the study.  I emphasized the importance of maximal medical management including strict control of blood pressure, blood glucose, and lipid levels, obtaining regular exercise, and cessation of smoking.    She will follow up with my in 2 weeks or earlier if the CTA is concerning.  Thank you for allowing [place] to participate in this patient's care.  Leonides Sake, MD [facility] [place] Office: [phone] Pager: [phone]  06/10/2011, 5:07 PM

## 2011-06-13 ENCOUNTER — Ambulatory Visit
Admission: RE | Admit: 2011-06-13 | Discharge: 2011-06-13 | Disposition: A | Discharge status: Home / Self Care | Payer: Medicare Other | Source: Ambulatory Visit | Attending: Vascular Surgery | Admitting: Vascular Surgery

## 2011-06-13 DIAGNOSIS — I714 Abdominal aortic aneurysm, without rupture: Secondary | ICD-10-CM

## 2011-06-13 MED ORDER — IOHEXOL 300 MG/ML  SOLN
75.0000 mL | Freq: Once | INTRAMUSCULAR | Status: AC | PRN
  Administered 2011-06-13: 75 mL via INTRAVENOUS

## 2011-06-23 ENCOUNTER — Encounter: Payer: Self-pay | Admitting: Vascular Surgery

## 2011-06-24 ENCOUNTER — Encounter: Payer: Self-pay | Admitting: Vascular Surgery

## 2011-06-24 ENCOUNTER — Ambulatory Visit (INDEPENDENT_AMBULATORY_CARE_PROVIDER_SITE_OTHER): Payer: Medicare Other | Admitting: Vascular Surgery

## 2011-06-24 VITALS — BP 146/57 | HR 54 | Resp 16 | Ht 64.0 in | Wt 109.0 lb

## 2011-06-24 DIAGNOSIS — I714 Abdominal aortic aneurysm, without rupture: Secondary | ICD-10-CM

## 2011-06-24 NOTE — Progress Notes (Signed)
[facility] [place]  Established Abdominal Aortic Aneurysm  History of Present Illness  The patient is a 77 y.o. female who presents with chief complaint: follow up CTA for AAA. Previous studies demonstrate an AAA, measuring 3.8 cm x 3.7 cm. The patient does not have back or abdominal pain. The patient is a former smoker. She recently had an AAA duplex which demonstrated increase in size to: 4.4 cm x 4.6 cm.    The patient's PMH, PSH, SH, FamHx, Med, Allergies and ROS are unchanged from 07/02/10.   Physical Examination  Filed Vitals:   06/24/11 1124  BP: 146/57  Pulse: 54  Resp: 16  Height: 5\' 4"  (1.626 m)  Weight: 109 lb (49.442 kg)  SpO2: 99%   General: A&O x 3, WDWN, cachectic   Pulmonary: Sym exp, good air movt, CTAB, no rales, rhonchi, & wheezing   Cardiac: RRR, Nl S1, S2, no Murmurs, rubs or gallops   Vascular:  Vessel  Right  Left   Radial  Palpable  Palpable   Brachial  Palpable  Palpable   Carotid  Palpable, without bruit  Palpable, without bruit   Aorta  Prominently Palpable  N/A   Femoral  Palpable  Palpable   Popliteal  Palpable  Palpable   PT  Palpable  Palpable   DP  Palpable  Palpable    Gastrointestinal: soft, NTND, -G/R, - HSM, - masses, - CVAT B , palpable midline mass  Musculoskeletal: M/S 5/5 throughout , Extremities without ischemic changes   Neurologic: Pain and light touch intact in extremities , Motor exam as listed above   CTA Abd/Pelvis (06/13/11): 1. 4.5 cm fusiform infrarenal abdominal aortic aneurysm as above.  2. This aneurysm begins just below the renal arteries with no significant normal caliber infrarenal neck present.  3. No evidence of aneurysm rupture or dissection.  Based on my review of the patient's CTA, she has a juxtarenal AAA which continues to be small at 4.5 cm.  There are no concerning findings on the scan.  Medical Decision Making  The patient is a 77 y.o. female who presents with: asx enlarging  AAA.  In this 77 year old patient with multiple comorbidities, her one year AAA rupture risk (3-5%) remains lower than her operative risk for open repair (5-10%). Given a suprarenal clamp may be necessary, which may cause acute renal failure, I think a non-operative strategy would be the best option in her case. I would repeat the AAA duplex in 3 months.  If she continues to grow at that point, I would consider referring her to Dr. Pattricia Boss at [facility] for consideration for fenestrated endografting as we do not have available access to the fenestrated endograft in the [place]. I emphasized the importance of maximal medical management including strict control of blood pressure, blood glucose, and lipid levels, obtaining regular exercise, and cessation of smoking.  She knows to go the ER immediately if she develops any severe abdominal or back pain. Thank you for allowing [place] to participate in this patient's care.  Leonides Sake, MD [facility] [place] Office: [phone] Pager: [phone]  06/24/2011, 6:58 PM

## 2011-07-08 ENCOUNTER — Ambulatory Visit: Payer: Medicare Other | Admitting: Vascular Surgery

## 2011-08-31 ENCOUNTER — Ambulatory Visit: Payer: Medicare Other | Admitting: Internal Medicine

## 2011-09-05 ENCOUNTER — Encounter: Payer: Self-pay | Admitting: Cardiovascular Disease

## 2011-09-05 ENCOUNTER — Ambulatory Visit (INDEPENDENT_AMBULATORY_CARE_PROVIDER_SITE_OTHER): Payer: Medicare Other | Admitting: Cardiovascular Disease

## 2011-09-05 VITALS — BP 100/60 | HR 61 | Ht 64.0 in | Wt 112.1 lb

## 2011-09-05 DIAGNOSIS — I714 Abdominal aortic aneurysm, without rupture: Secondary | ICD-10-CM

## 2011-09-05 DIAGNOSIS — I2510 Atherosclerotic heart disease of native coronary artery without angina pectoris: Secondary | ICD-10-CM

## 2011-09-05 DIAGNOSIS — I10 Essential (primary) hypertension: Secondary | ICD-10-CM

## 2011-09-05 NOTE — Assessment & Plan Note (Signed)
She is not having any angina 

## 2011-09-05 NOTE — Progress Notes (Signed)
[name] Date of Birth  [birth year] [facility]     [place] Office  [address]    [address]   [address] [place], [place]  [address]    [place], [place]  [address] [phone]  Fax  [phone]  [phone]  Fax [phone]  Problem List: 1. CAD 2. Abdominal Aortic aneurism 3.  Hyperlipidemia  History of Present Illness:  She has no complaints.  She did not take her meds this am.  She stopped smoking several months ago. As a result she's gaining a little bit of weight. She has seen Dr. Imogene Burn for further evaluation of her abdominal aortic aneurysm. She's not had that fixed yet.  Current Outpatient Prescriptions on File Prior to Visit  Medication Sig Dispense Refill  . amLODipine (NORVASC) 10 MG tablet Take 1 tablet (10 mg total) by mouth daily.  30 tablet  5  . clopidogrel (PLAVIX) 75 MG tablet Take 1 tablet (75 mg total) by mouth daily.  30 tablet  5  . IBUPROFEN PO Take by mouth as needed.        . metoprolol tartrate (LOPRESSOR) 25 MG tablet Take 25 mg by mouth 2 (two) times daily.        . Multiple Vitamin (MULTIVITAMIN PO) Take by mouth daily.        . nitroGLYCERIN (NITROSTAT) 0.4 MG SL tablet Place 0.4 mg under the tongue every 5 (five) minutes as needed.        . pravastatin (PRAVACHOL) 40 MG tablet Take 1 tablet (40 mg total) by mouth daily.  90 tablet  4    Allergies  Allergen Reactions  . Alendronate Sodium   . Chlordiazepoxide   . Librium   . Zocor (Simvastatin)     Leg cramps    Past Medical History  Diagnosis Date  . DJD (degenerative joint disease)   . CAD (coronary artery disease)   . Hypertension   . Hyperlipidemia   . Abdominal aortic aneurysm   . Osteopenia   . Aortic insufficiency   . Broken hip     left    Past Surgical History  Procedure Date  . Cataract extraction   . Abdominal hysterectomy   . Cholecystectomy   . Tonsillectomy   . Ptca   . Appendectomy   . Cardiac catheterization 2000    PTCA,and  stenting to RCA  . Hip fracture surgery     Left hip ORIF    History  Smoking status  . Former Smoker  . Quit date: 01/09/2011  Smokeless tobacco  . Not on file    History  Alcohol Use No    Family History  Problem Relation Age of Onset  . Coronary artery disease Mother   . Dementia Sister   . Coronary artery disease Brother   . Cancer Sister     Reviw of Systems:  Reviewed in the HPI.  All other systems are negative.  Physical Exam: Blood pressure 100/60, pulse 61, height 5\' 4"  (1.626 m), weight 112 lb 1.9 oz (50.857 kg). General: Well developed, well nourished, in no acute distress.  Head: Normocephalic, atraumatic, sclera non-icteric, mucus membranes are moist,   Neck: Supple. Soft right carotid bruit. JVD not elevated.  Lungs: Clear bilaterally to auscultation without wheezes, rales, or rhonchi. Breathing is unlabored.  Heart: RRR with S1 S2.  There is a soft systolic murmur  Abdomen: Soft, non-tender, She has a moderately large pulsitile abdominal mass c/w AAA.  Soft bruit.  Msk:  Strength and tone appear normal for age.  Extremities: No clubbing or cyanosis. No edema.  Distal pedal pulses are 2+ and equal bilaterally.  Neuro: Alert and oriented X 3. Moves all extremities spontaneously.  Psych:  Responds to questions appropriately with a normal affect.  ECG: NSR. No ST or T changes  Assessment / Plan:

## 2011-09-05 NOTE — Patient Instructions (Signed)
Your physician wants you to follow-up in: 6 MONTHS You will receive a reminder letter in the mail two months in advance. If you don't receive a letter, please call our office to schedule the follow-up appointment.  Your physician recommends that you return for a FASTING lipid profile: 6 MONTHS  Your physician recommends that you continue on your current medications as directed. Please refer to the Current Medication list given to you today.     

## 2011-09-05 NOTE — Assessment & Plan Note (Addendum)
[name]  blood pressure was much better controlled after we let her sit and rest  for a few minutes. She is to continue with her same medications.  She'll followup with Dr. Imogene Burn for further evaluation of her abdominal aortic aneurysm.

## 2011-09-14 ENCOUNTER — Other Ambulatory Visit: Payer: Self-pay | Admitting: Cardiovascular Disease

## 2011-09-15 ENCOUNTER — Other Ambulatory Visit: Payer: Self-pay | Admitting: *Deleted

## 2011-09-15 MED ORDER — NITROGLYCERIN 0.4 MG SL SUBL: 0.4000 mg | SUBLINGUAL_TABLET | SUBLINGUAL | Status: DC | PRN

## 2011-09-23 ENCOUNTER — Encounter: Payer: Self-pay | Admitting: Vascular Surgery

## 2011-09-23 ENCOUNTER — Encounter (INDEPENDENT_AMBULATORY_CARE_PROVIDER_SITE_OTHER): Payer: Medicare Other | Admitting: Vascular Surgery

## 2011-09-23 ENCOUNTER — Ambulatory Visit (INDEPENDENT_AMBULATORY_CARE_PROVIDER_SITE_OTHER): Payer: Medicare Other | Admitting: Vascular Surgery

## 2011-09-23 VITALS — BP 154/85 | HR 67 | Resp 18 | Ht 64.0 in | Wt 110.0 lb

## 2011-09-23 DIAGNOSIS — I714 Abdominal aortic aneurysm, without rupture, unspecified: Secondary | ICD-10-CM | POA: Insufficient documentation

## 2011-09-23 NOTE — Progress Notes (Signed)
[facility] [place]  Established Abdominal Aortic Aneurysm  History of Present Illness  The patient is a 78 y.o. (03-02-1924) female who presents with chief complaint: follow up for AAA.  Previous studies demonstrate an AAA, measuring 4.4 cm x 4.6 cm.  The patient does not have back or abdominal pain.  The patient is not a smoker.  The patient's PMH, PSH, SH, FamHx, Med, Allergies and ROS are unchanged from 06/23/12.  Physical Examination  Filed Vitals:   09/23/11 0939  BP: 154/85  Pulse: 67  Resp: 18  Height: 5\' 4"  (1.626 m)  Weight: 110 lb (49.896 kg)   Body mass index is 18.88 kg/(m^2).  General: A&O x 3, WDWN, Cachectic   Pulmonary: Sym exp, good air movt, CTAB, no rales, rhonchi, & wheezing  Cardiac: RRR, Nl S1, S2, no Murmurs, rubs or gallops  Vascular: Vessel Right Left  Radial Palpable Palpable  Brachial Palpable Palpable  Carotid Palpable, without bruit Palpable, without bruit  Aorta Non-palpable N/A  Femoral Palpable Palpable  Popliteal Non-palpable Non-palpable  PT Palpable Palpable  DP Palpable Palpable   Gastrointestinal: soft, NTND, -G/R, - HSM, - masses, - CVAT B  Musculoskeletal: M/S 5/5 throughout , Extremities without ischemic changes , B CVI changes  Neurologic: Pain and light touch intact in extremities , Motor exam as listed above  Non-Invasive Vascular Imaging  AAA Duplex (Date: 09/23/11)  Previous size: 4.4 cm x 4.6 cm (Date: 06/06/11)  Current size:  4.46 cm x 4.48 cm (Date: 09/23/11)  Medical Decision Making  The patient is a 78 y.o. female who presents with: stable, asx juxtarenal AAA.   Based on this patient's exam and diagnostic studies, she needs continued q6 month surveillance, which I have ordered.  She will follow up with our NP at the next visit.  The threshold for repair is AAA size > 5.5 cm, growth > 1 cm/yr, and symptomatic status.  Given her juxtarenal anatomy, she would need a fenestrated EVAR as I  doubt she would survive an open repair without complications.  I emphasized the importance of maximal medical management including strict control of blood pressure, blood glucose, and lipid levels, antiplatelet agents, obtaining regular exercise, and cessation of smoking.    Thank you for allowing [place] to participate in this patient's care.  Leonides Sake, MD [facility] [place] Office: [phone] Pager: [phone]  09/23/2011, 10:04 AM

## 2011-09-30 NOTE — Procedures (Unsigned)
DUPLEX ULTRASOUND OF ABDOMINAL AORTA  INDICATION:  Abdominal aortic aneurysm  HISTORY: Diabetes:  No Cardiac:  PTCA Hypertension:  Yes Smoking:  Previous, quit 12/2010 Connective Tissue Disorder: Family History:  No Previous Surgery:  No  DUPLEX EXAM:         AP (cm)                   TRANSVERSE (cm) Proximal             2.05 cm                   2.02 cm Mid                  2.36 cm                   2.37 cm Distal               4.46 cm                   4.48 cm Right Iliac          1.04 cm                   1.08 cm Left Iliac           0.91 cm                   0.84 cm  PREVIOUS:  Date:  06/13/2011  AP:  TRANSVERSE:  4.5  IMPRESSION: 1. Infrarenal abdominal aortic aneurysm measuring 4.46 cm x 4.48 cm,     with intramural thrombus present. 2. Essentially unchanged since previous CT done on 06/13/2011.  ___________________________________________ Fransisco Hertz, MD  SH/MEDQ  D:  09/23/2011  T:  09/23/2011  Job:  [id number]

## 2011-11-04 ENCOUNTER — Encounter (HOSPITAL_COMMUNITY): Payer: Self-pay

## 2011-11-04 ENCOUNTER — Emergency Department (INDEPENDENT_AMBULATORY_CARE_PROVIDER_SITE_OTHER)
Admission: EM | Admit: 2011-11-04 | Discharge: 2011-11-04 | Disposition: A | Discharge status: Home / Self Care | Payer: Medicare Other | Source: Home / Self Care | Attending: Family Medicine | Admitting: Family Medicine

## 2011-11-04 DIAGNOSIS — L259 Unspecified contact dermatitis, unspecified cause: Secondary | ICD-10-CM

## 2011-11-04 DIAGNOSIS — L309 Dermatitis, unspecified: Secondary | ICD-10-CM

## 2011-11-04 LAB — GLUCOSE, CAPILLARY: Glucose-Capillary: 94 mg/dL (ref 70–99)

## 2011-11-04 MED ORDER — TRIAMCINOLONE ACETONIDE 0.1 % EX OINT: TOPICAL_OINTMENT | Freq: Two times a day (BID) | CUTANEOUS | Status: AC

## 2011-11-04 NOTE — ED Notes (Signed)
C/o itchy rash on her back for a couple of weeks, hard to sleep due to itching; pt also became dizzy when she stood in lobby to come back into treatment room; pt consume toaster pastry this AM , and has not eaten since (usually snacks through the day) provided w snack, CBG done ( result 94  ) NAD , skin w/d/pale; denies other syx

## 2011-11-04 NOTE — ED Provider Notes (Signed)
History     CSN: [id number]  Arrival date & time 11/04/11  1023   First MD Initiated Contact with Patient 11/04/11 1037      Chief Complaint  Patient presents with  . Rash    (Consider location/radiation/quality/duration/timing/severity/associated sxs/prior treatment) HPI Comments: [name] presents for evaluation of an itchy rash over the midline of her back from the thoracic to the lumbar area. If she reports onset 2 weeks ago. She denies any pain.  Patient is a 78 y.o. female presenting with rash. The history is provided by the patient.  Rash  This is a new problem. The current episode started more than 1 week ago. The problem has not changed since onset.The problem is associated with an unknown factor. There has been no fever. The rash is present on the back. The pain has been constant since onset. Associated symptoms include itching. Pertinent negatives include no pain. She has tried nothing for the symptoms.    Past Medical History  Diagnosis Date  . DJD (degenerative joint disease)   . CAD (coronary artery disease)   . Hypertension   . Hyperlipidemia   . Abdominal aortic aneurysm   . Osteopenia   . Aortic insufficiency   . Broken hip     left    Past Surgical History  Procedure Date  . Cataract extraction   . Abdominal hysterectomy   . Cholecystectomy   . Tonsillectomy   . Ptca   . Appendectomy   . Cardiac catheterization 2000    PTCA,and stenting to RCA  . Hip fracture surgery     Left hip ORIF    Family History  Problem Relation Age of Onset  . Coronary artery disease Mother   . Dementia Sister   . Coronary artery disease Brother   . Cancer Sister     History  Substance Use Topics  . Smoking status: Former Smoker -- 50 years    Types: Cigarettes    Quit date: 01/09/2011  . Smokeless tobacco: Never Used  . Alcohol Use: No    OB History    Grav Para Term Preterm Abortions TAB SAB Ect Mult Living                  Review of Systems    Constitutional: Negative.   HENT: Negative.   Eyes: Negative.   Respiratory: Negative.   Cardiovascular: Negative.   Gastrointestinal: Negative.   Genitourinary: Negative.   Musculoskeletal: Negative.   Skin: Positive for itching and rash.  Neurological: Negative.     Allergies  Alendronate sodium; Chlordiazepoxide; Librium; and Zocor  Home Medications   Current Outpatient Rx  Name Route Sig Dispense Refill  . AMLODIPINE BESYLATE 10 MG PO TABS Oral Take 1 tablet (10 mg total) by mouth daily. 30 tablet 5  . CLOPIDOGREL BISULFATE 75 MG PO TABS Oral Take 1 tablet (75 mg total) by mouth daily. 30 tablet 5  . IBUPROFEN PO Oral Take by mouth as needed.      Marland Kitchen METOPROLOL TARTRATE 25 MG PO TABS  TAKE ONE TABLET BY MOUTH TWICE DAILY 180 tablet 2  . MULTIVITAMIN PO Oral Take by mouth daily.      Marland Kitchen NITROGLYCERIN 0.4 MG SL SUBL Sublingual Place 1 tablet (0.4 mg total) under the tongue every 5 (five) minutes as needed. 25 tablet 11  . PRAVASTATIN SODIUM 40 MG PO TABS Oral Take 1 tablet (40 mg total) by mouth daily. 90 tablet 4  . TRIAMCINOLONE ACETONIDE 0.1 %  EX OINT Topical Apply topically 2 (two) times daily. 80 g 0    BP 132/58  Pulse 80  Temp(Src) 97.6 F (36.4 C) (Oral)  Resp 18  SpO2 100%  Physical Exam  Nursing note and vitals reviewed. Constitutional: She is oriented to person, place, and time. She appears well-developed and well-nourished.  HENT:  Head: Normocephalic and atraumatic.  Eyes: EOM are normal.  Neck: Normal range of motion.  Pulmonary/Chest: Effort normal.  Musculoskeletal: Normal range of motion.  Neurological: She is alert and oriented to person, place, and time.  Skin: Skin is warm and dry. Rash noted. Rash is maculopapular. There is erythema.     Psychiatric: Her behavior is normal.    ED Course  Procedures (including critical care time)   Labs Reviewed  GLUCOSE, CAPILLARY   No results found.   1. Dermatitis       MDM  Likely contact  dermatitis; not suspicious for shingles; given rx for triamcinolone cream        Renaee Munda, MD 11/04/11 1244

## 2011-11-04 NOTE — Discharge Instructions (Signed)
Apply steroid cream to affected area as discussed. Do not use for longer than 2 consecutive weeks; apply sparingly. May also use an over the counter antihistamine such as loratadine (Claritin), cetirizine (Zyrtec), or fexofenadine (Allegra). Use diphenhydramine (Benadryl) with caution. Return to care should your symptoms not improve, or worsen in any way.

## 2011-11-07 ENCOUNTER — Ambulatory Visit (INDEPENDENT_AMBULATORY_CARE_PROVIDER_SITE_OTHER): Payer: Medicare Other | Admitting: Internal Medicine

## 2011-11-07 ENCOUNTER — Encounter: Payer: Self-pay | Admitting: Internal Medicine

## 2011-11-07 ENCOUNTER — Telehealth: Payer: Self-pay | Admitting: Internal Medicine

## 2011-11-07 VITALS — BP 120/70 | Temp 98.1°F | Wt 115.0 lb

## 2011-11-07 DIAGNOSIS — I10 Essential (primary) hypertension: Secondary | ICD-10-CM

## 2011-11-07 DIAGNOSIS — L259 Unspecified contact dermatitis, unspecified cause: Secondary | ICD-10-CM | POA: Diagnosis not present

## 2011-11-07 DIAGNOSIS — L309 Dermatitis, unspecified: Secondary | ICD-10-CM

## 2011-11-07 MED ORDER — HYDROXYZINE HCL 25 MG PO TABS: 25.0000 mg | ORAL_TABLET | Freq: Three times a day (TID) | ORAL | Status: AC | PRN

## 2011-11-07 NOTE — Progress Notes (Signed)
  Subjective:    Patient ID: [name], female    DOB: 1923/12/28, 78 y.o.   MRN: [id number]  HPI  78 year old patient who has a history of treated hypertension. She presents with a chief complaint of a pruritic rash involving her upper back region. She was evaluated and treated at the urgent care 3 days ago and treated with topical triamcinolone cream. This has not been too helpful. Denies any similar dermatitis in the past no recent pertinent exposure history    Review of Systems  Skin: Positive for rash.       Objective:   Physical Exam  Constitutional: She appears well-developed and well-nourished. No distress.       Blood pressure 120/70  Skin: Rash noted.       Pruritic slightly raised rash involves distribution in the mid upper back and shoulder region. Appeared urticarial and did blanch with pressure          Assessment & Plan:   Urticarial rash. We'll continue the topical triamcinolone. We use Atarax 25 mg every 8 hours for itching will treat with Depo-Medrol 40 mg IM. She'll call if not improved.

## 2011-11-07 NOTE — Telephone Encounter (Signed)
Pt has skin inflamation all over back, itching and burning. Pt went to Urgent Care and was get Triamcinalone Cr but it is not working. Pt req work in ov with Dr Kirtland Bouchard. Refuses to see another doctor.

## 2011-11-07 NOTE — Telephone Encounter (Signed)
Given work in appt to pt.

## 2011-11-07 NOTE — Patient Instructions (Signed)
  Apply triamcinolone cream twice daily  Call or return to clinic prn if these symptoms worsen or fail to improve as anticipated.  

## 2011-11-07 NOTE — Telephone Encounter (Signed)
Offer 415 today - let her know we are working her in  - or we can make appt for tomorrow. That is all I can offer - double book 415

## 2011-11-15 ENCOUNTER — Ambulatory Visit: Payer: Medicare Other | Admitting: Internal Medicine

## 2011-11-18 DIAGNOSIS — L259 Unspecified contact dermatitis, unspecified cause: Secondary | ICD-10-CM | POA: Diagnosis not present

## 2011-12-02 DIAGNOSIS — L259 Unspecified contact dermatitis, unspecified cause: Secondary | ICD-10-CM | POA: Diagnosis not present

## 2011-12-19 ENCOUNTER — Other Ambulatory Visit: Payer: Self-pay | Admitting: Cardiovascular Disease

## 2011-12-20 NOTE — Telephone Encounter (Signed)
Fax Received. Refill Completed. Dorsel Flinn Chowoe (R.M.A)   

## 2011-12-30 DIAGNOSIS — L259 Unspecified contact dermatitis, unspecified cause: Secondary | ICD-10-CM | POA: Diagnosis not present

## 2012-01-21 DIAGNOSIS — H3581 Retinal edema: Secondary | ICD-10-CM | POA: Diagnosis not present

## 2012-01-21 DIAGNOSIS — Z961 Presence of intraocular lens: Secondary | ICD-10-CM | POA: Diagnosis not present

## 2012-01-21 DIAGNOSIS — H3530 Unspecified macular degeneration: Secondary | ICD-10-CM | POA: Diagnosis not present

## 2012-01-21 DIAGNOSIS — H04129 Dry eye syndrome of unspecified lacrimal gland: Secondary | ICD-10-CM | POA: Diagnosis not present

## 2012-03-23 ENCOUNTER — Encounter: Payer: Self-pay | Admitting: Neurosurgery

## 2012-03-27 ENCOUNTER — Encounter: Payer: Self-pay | Admitting: Neurosurgery

## 2012-03-27 ENCOUNTER — Ambulatory Visit (INDEPENDENT_AMBULATORY_CARE_PROVIDER_SITE_OTHER): Payer: Medicare Other | Admitting: Neurosurgery

## 2012-03-27 ENCOUNTER — Encounter (INDEPENDENT_AMBULATORY_CARE_PROVIDER_SITE_OTHER): Payer: Medicare Other | Admitting: *Deleted

## 2012-03-27 VITALS — BP 131/58 | HR 65 | Resp 14 | Ht 64.0 in | Wt 109.7 lb

## 2012-03-27 DIAGNOSIS — I714 Abdominal aortic aneurysm, without rupture: Secondary | ICD-10-CM | POA: Diagnosis not present

## 2012-03-27 NOTE — Progress Notes (Signed)
[facility] Office Note  CC: Six-month AAA surveillance Referring Physician: Imogene Burn  History of Present Illness: 78 year old female patient of Dr. Imogene Burn followed for known AAA. The patient denies any unusual abdominal or back pain. The patient denies any new medical diagnoses or recent surgery.  Past Medical History  Diagnosis Date  . DJD (degenerative joint disease)   . CAD (coronary artery disease)   . Hypertension   . Hyperlipidemia   . Abdominal aortic aneurysm   . Osteopenia   . Aortic insufficiency   . Broken hip     left    ROS: [x]  Positive   [ ]  Denies    General: [ ]  Weight loss, [ ]  Fever, [ ]  chills Neurologic: [ ]  Dizziness, [ ]  Blackouts, [ ]  Seizure [ ]  Stroke, [ ]  "Mini stroke", [ ]  Slurred speech, [ ]  Temporary blindness; [ ]  weakness in arms or legs, [ ]  Hoarseness Cardiac: [ ]  Chest pain/pressure, [ ]  Shortness of breath at rest [ ]  Shortness of breath with exertion, [ ]  Atrial fibrillation or irregular heartbeat Vascular: [ ]  Pain in legs with walking, [ ]  Pain in legs at rest, [ ]  Pain in legs at night,  [ ]  Non-healing ulcer, [ ]  Blood clot in vein/DVT,   Pulmonary: [ ]  Home oxygen, [ ]  Productive cough, [ ]  Coughing up blood, [ ]  Asthma,  [ ]  Wheezing Musculoskeletal:  [ ]  Arthritis, [ ]  Low back pain, [ ]  Joint pain Hematologic: [ ]  Easy Bruising, [ ]  Anemia; [ ]  Hepatitis Gastrointestinal: [ ]  Blood in stool, [ ]  Gastroesophageal Reflux/heartburn, [ ]  Trouble swallowing Urinary: [ ]  chronic Kidney disease, [ ]  on HD - [ ]  MWF or [ ]  TTHS, [ ]  Burning with urination, [ ]  Difficulty urinating Skin: [ ]  Rashes, [ ]  Wounds Psychological: [ ]  Anxiety, [ ]  Depression   Social History History  Substance Use Topics  . Smoking status: Former Smoker -- 50 years    Types: Cigarettes    Quit date: 01/09/2011  . Smokeless tobacco: Never Used  . Alcohol Use: No    Family History Family History  Problem Relation Age of  Onset  . Coronary artery disease Mother   . Heart disease Mother   . Dementia Sister   . Heart disease Sister   . Coronary artery disease Brother   . Cancer Sister     Allergies  Allergen Reactions  . Alendronate Sodium   . Chlordiazepoxide   . Librium   . Zocor (Simvastatin)     Leg cramps    Current Outpatient Prescriptions  Medication Sig Dispense Refill  . amLODipine (NORVASC) 10 MG tablet TAKE ONE TABLET BY MOUTH EVERY DAY  30 tablet  5  . clopidogrel (PLAVIX) 75 MG tablet TAKE ONE TABLET BY MOUTH EVERY DAY  30 tablet  5  . hydrOXYzine (ATARAX/VISTARIL) 25 MG tablet as needed.      . IBUPROFEN PO Take by mouth as needed.        . metoprolol tartrate (LOPRESSOR) 25 MG tablet TAKE ONE TABLET BY MOUTH TWICE DAILY  180 tablet  2  . nitroGLYCERIN (NITROSTAT) 0.4 MG SL tablet Place 1 tablet (0.4 mg total) under the tongue every 5 (five) minutes as needed.  25 tablet  11  . pravastatin (PRAVACHOL) 40 MG tablet Take 1 tablet (40 mg total) by mouth daily.  90 tablet  4  . Multiple Vitamin (MULTIVITAMIN PO) Take by  mouth daily.        Marland Kitchen triamcinolone ointment (KENALOG) 0.1 % Apply topically 2 (two) times daily.  80 g  0    Physical Examination  Filed Vitals:   03/27/12 1046  BP: 131/58  Pulse: 65  Resp: 14    Body mass index is 18.83 kg/(m^2).  General:  WDWN in NAD Gait: Normal HEENT: WNL Eyes: Pupils equal Pulmonary: normal non-labored breathing , without Rales, rhonchi,  wheezing Cardiac: RRR, without  Murmurs, rubs or gallops; No carotid bruits Abdomen: soft, NT, no masses Skin: no rashes, ulcers noted Vascular Exam/Pulses: Palpable femoral pulses bilaterally, there is a palpable pulsatile abdominal AAA  Extremities without ischemic changes, no Gangrene , no cellulitis; no open wounds;  Musculoskeletal: no muscle wasting or atrophy  Neurologic: A&O X 3; Appropriate Affect ; SENSATION: normal; MOTOR FUNCTION:  moving all extremities equally. Speech is  fluent/normal  Non-Invasive Vascular Imaging: AAA duplex shows a maximum diameter of 4.5 cm which is virtually unchanged from previous exam in March 2013  ASSESSMENT/PLAN: The patient will followup in 6 months for repeat AAA duplex, her questions were encouraged and answered, she is in agreement with this plan.  Lauree Chandler ANP  Clinic M.D.: [place]

## 2012-03-27 NOTE — Addendum Note (Signed)
Addended by: Sharee Pimple on: 03/27/2012 01:30 PM   Modules accepted: Orders

## 2012-03-28 ENCOUNTER — Other Ambulatory Visit: Payer: Self-pay | Admitting: Internal Medicine

## 2012-04-03 DIAGNOSIS — L259 Unspecified contact dermatitis, unspecified cause: Secondary | ICD-10-CM | POA: Diagnosis not present

## 2012-04-10 DIAGNOSIS — L259 Unspecified contact dermatitis, unspecified cause: Secondary | ICD-10-CM | POA: Diagnosis not present

## 2012-06-04 ENCOUNTER — Ambulatory Visit (INDEPENDENT_AMBULATORY_CARE_PROVIDER_SITE_OTHER): Payer: Medicare Other | Admitting: Cardiovascular Disease

## 2012-06-04 ENCOUNTER — Encounter: Payer: Self-pay | Admitting: Cardiovascular Disease

## 2012-06-04 VITALS — BP 122/60 | HR 59 | Ht 64.0 in | Wt 115.0 lb

## 2012-06-04 DIAGNOSIS — I10 Essential (primary) hypertension: Secondary | ICD-10-CM

## 2012-06-04 DIAGNOSIS — I2510 Atherosclerotic heart disease of native coronary artery without angina pectoris: Secondary | ICD-10-CM | POA: Diagnosis not present

## 2012-06-04 DIAGNOSIS — I714 Abdominal aortic aneurysm, without rupture: Secondary | ICD-10-CM | POA: Diagnosis not present

## 2012-06-04 MED ORDER — PRAVASTATIN SODIUM 40 MG PO TABS: 40.0000 mg | ORAL_TABLET | Freq: Every day | ORAL | Status: DC

## 2012-06-04 MED ORDER — AMLODIPINE BESYLATE 10 MG PO TABS: 10.0000 mg | ORAL_TABLET | Freq: Every day | ORAL | Status: DC

## 2012-06-04 MED ORDER — METOPROLOL TARTRATE 25 MG PO TABS: 25.0000 mg | ORAL_TABLET | Freq: Two times a day (BID) | ORAL | Status: DC

## 2012-06-04 MED ORDER — CLOPIDOGREL BISULFATE 75 MG PO TABS: 75.0000 mg | ORAL_TABLET | Freq: Every day | ORAL | Status: DC

## 2012-06-04 NOTE — Progress Notes (Signed)
Ruffin Frederick Date of Birth  June 06, 1924 [facility]     [place] Office  [address]    [address]   [address] [place], [place]  [address]    [place], [place]  [address] [phone]  Fax  [phone]  [phone]  Fax [phone]  Problem List: 1. CAD 2. Abdominal Aortic aneurism 3.  Hyperlipidemia  History of Present Illness:  She has no complaints.  She did not take her meds this am.  She stopped smoking several months ago. As a result she's gaining a little bit of weight. She has seen Dr. Imogene Burn for further evaluation of her abdominal aortic aneurysm. She has been getting follow up exams. She has not needed surgery for aneurysm yet to  Current Outpatient Prescriptions on File Prior to Visit  Medication Sig Dispense Refill  . amLODipine (NORVASC) 10 MG tablet TAKE ONE TABLET BY MOUTH EVERY DAY  30 tablet  5  . clopidogrel (PLAVIX) 75 MG tablet TAKE ONE TABLET BY MOUTH EVERY DAY  30 tablet  5  . hydrOXYzine (ATARAX/VISTARIL) 25 MG tablet as needed.      . IBUPROFEN PO Take by mouth as needed.        . metoprolol tartrate (LOPRESSOR) 25 MG tablet TAKE ONE TABLET BY MOUTH TWICE DAILY  180 tablet  2  . Multiple Vitamin (MULTIVITAMIN PO) Take by mouth daily.        . nitroGLYCERIN (NITROSTAT) 0.4 MG SL tablet Place 1 tablet (0.4 mg total) under the tongue every 5 (five) minutes as needed.  25 tablet  11  . pravastatin (PRAVACHOL) 40 MG tablet TAKE ONE TABLET BY MOUTH EVERY DAY  90 tablet  0  . triamcinolone ointment (KENALOG) 0.1 % Apply topically 2 (two) times daily.  80 g  0    Allergies  Allergen Reactions  . Alendronate Sodium   . Chlordiazepoxide   . Librium   . Zocor (Simvastatin)     Leg cramps    Past Medical History  Diagnosis Date  . DJD (degenerative joint disease)   . CAD (coronary artery disease)   . Hypertension   . Hyperlipidemia   . Abdominal aortic aneurysm   . Osteopenia   . Aortic insufficiency   . Broken hip     left     Past Surgical History  Procedure Date  . Cataract extraction   . Abdominal hysterectomy   . Cholecystectomy   . Tonsillectomy   . Ptca   . Appendectomy   . Cardiac catheterization 2000    PTCA,and stenting to RCA  . Hip fracture surgery     Left hip ORIF    History  Smoking status  . Former Smoker -- 50 years  . Types: Cigarettes  . Quit date: 01/09/2011  Smokeless tobacco  . Never Used    History  Alcohol Use No    Family History  Problem Relation Age of Onset  . Coronary artery disease Mother   . Heart disease Mother   . Dementia Sister   . Heart disease Sister   . Coronary artery disease Brother   . Cancer Sister     Reviw of Systems:  Reviewed in the HPI.  All other systems are negative.  Physical Exam: Blood pressure 122/60, pulse 59, height 5\' 4"  (1.626 m), weight 115 lb (52.164 kg), SpO2 98.00%. General: Well developed, well nourished, in no acute distress.  Head: Normocephalic, atraumatic, sclera non-icteric, mucus membranes are moist,  Neck: Supple. Soft right carotid bruit. JVD not elevated.  Lungs: Clear bilaterally to auscultation without wheezes, rales, or rhonchi. Breathing is unlabored.  Heart: RRR with S1 S2.  There is a soft systolic murmur  Abdomen: Soft, non-tender, She has a moderately large pulsitile abdominal mass c/w AAA.  Soft bruit.  Msk:  Strength and tone appear normal for age.  Extremities: No clubbing or cyanosis. No edema.  Distal pedal pulses are 2+ and equal bilaterally.  Neuro: Alert and oriented X 3. Moves all extremities spontaneously.  Psych:  Responds to questions appropriately with a normal affect.  ECG:  Assessment / Plan:

## 2012-06-04 NOTE — Assessment & Plan Note (Signed)
Mrs. Magner is doing well. She's not had any episodes of chest pain or shortness breath. She remains on Plavix. She's having lots of bruising particularly on her arms. I would like for her to continue the Plavix as long she's not having serious bleeding. She'll have to stop it when she has her aneurysm fixed

## 2012-06-04 NOTE — Patient Instructions (Addendum)
Your physician wants you to follow-up in: 6 months  You will receive a reminder letter in the mail two months in advance. If you don't receive a letter, please call our office to schedule the follow-up appointment.  Your physician recommends that you return for a FASTING lipid profile: 6 months   

## 2012-06-11 ENCOUNTER — Ambulatory Visit (INDEPENDENT_AMBULATORY_CARE_PROVIDER_SITE_OTHER): Payer: Medicare Other | Admitting: Internal Medicine

## 2012-06-11 ENCOUNTER — Encounter: Payer: Self-pay | Admitting: Internal Medicine

## 2012-06-11 VITALS — BP 130/70 | HR 74 | Temp 97.6°F | Resp 16 | Wt 116.0 lb

## 2012-06-11 DIAGNOSIS — Z23 Encounter for immunization: Secondary | ICD-10-CM

## 2012-06-11 DIAGNOSIS — I2510 Atherosclerotic heart disease of native coronary artery without angina pectoris: Secondary | ICD-10-CM

## 2012-06-11 DIAGNOSIS — E785 Hyperlipidemia, unspecified: Secondary | ICD-10-CM | POA: Diagnosis not present

## 2012-06-11 DIAGNOSIS — G459 Transient cerebral ischemic attack, unspecified: Secondary | ICD-10-CM | POA: Diagnosis not present

## 2012-06-11 DIAGNOSIS — I10 Essential (primary) hypertension: Secondary | ICD-10-CM

## 2012-06-11 NOTE — Progress Notes (Signed)
Subjective:    Patient ID: [name], female    DOB: Nov 24, 1923, 78 y.o.   MRN: [id number]  HPI  78 year old patient who is seen today for her six-month followup. She has coronary artery disease and has been seen within the past week by cardiology. She remained stable. She is also followed by vascular surgery for AAA which has been stable. She denies any cardiopulmonary complaints. She has dyslipidemia and remains on pravastatin which she continues to tolerate well. Her only complaint is some bruising related to Plavix therapy.  Past Medical History  Diagnosis Date  . DJD (degenerative joint disease)   . CAD (coronary artery disease)   . Hypertension   . Hyperlipidemia   . Abdominal aortic aneurysm   . Osteopenia   . Aortic insufficiency   . Broken hip     left    History   Social History  . Marital Status: Widowed    Spouse Name: N/A    Number of Children: N/A  . Years of Education: N/A   Occupational History  . Not on file.   Social History Main Topics  . Smoking status: Former Smoker -- 50 years    Types: Cigarettes    Quit date: 01/09/2011  . Smokeless tobacco: Never Used  . Alcohol Use: No  . Drug Use: No  . Sexually Active: Not on file   Other Topics Concern  . Not on file   Social History Narrative  . No narrative on file    Past Surgical History  Procedure Date  . Cataract extraction   . Abdominal hysterectomy   . Cholecystectomy   . Tonsillectomy   . Ptca   . Appendectomy   . Cardiac catheterization 2000    PTCA,and stenting to RCA  . Hip fracture surgery     Left hip ORIF    Family History  Problem Relation Age of Onset  . Coronary artery disease Mother   . Heart disease Mother   . Dementia Sister   . Heart disease Sister   . Coronary artery disease Brother   . Cancer Sister     Allergies  Allergen Reactions  . Alendronate Sodium   . Chlordiazepoxide   . Librium   . Zocor (Simvastatin)     Leg cramps    Current Outpatient  Prescriptions on File Prior to Visit  Medication Sig Dispense Refill  . amLODipine (NORVASC) 10 MG tablet Take 1 tablet (10 mg total) by mouth daily.  30 tablet  11  . clopidogrel (PLAVIX) 75 MG tablet Take 1 tablet (75 mg total) by mouth daily.  30 tablet  11  . hydrOXYzine (ATARAX/VISTARIL) 25 MG tablet as needed.      . IBUPROFEN PO Take by mouth as needed.        . metoprolol tartrate (LOPRESSOR) 25 MG tablet Take 1 tablet (25 mg total) by mouth 2 (two) times daily.  60 tablet  11  . Multiple Vitamin (MULTIVITAMIN PO) Take by mouth daily.        . nitroGLYCERIN (NITROSTAT) 0.4 MG SL tablet Place 1 tablet (0.4 mg total) under the tongue every 5 (five) minutes as needed.  25 tablet  11  . pravastatin (PRAVACHOL) 40 MG tablet Take 1 tablet (40 mg total) by mouth daily.  30 tablet  11  . triamcinolone ointment (KENALOG) 0.1 % Apply topically 2 (two) times daily.  80 g  0    BP 130/70  Pulse 74  Temp 97.6 F (  36.4 C) (Oral)  Resp 16  Wt 116 lb (52.617 kg)      Review of Systems  Constitutional: Negative.   HENT: Negative for hearing loss, congestion, sore throat, rhinorrhea, dental problem, sinus pressure and tinnitus.   Eyes: Negative for pain, discharge and visual disturbance.  Respiratory: Negative for cough and shortness of breath.   Cardiovascular: Negative for chest pain, palpitations and leg swelling.  Gastrointestinal: Negative for nausea, vomiting, abdominal pain, diarrhea, constipation, blood in stool and abdominal distention.  Genitourinary: Negative for dysuria, urgency, frequency, hematuria, flank pain, vaginal bleeding, vaginal discharge, difficulty urinating, vaginal pain and pelvic pain.  Musculoskeletal: Negative for joint swelling, arthralgias and gait problem.  Skin: Negative for rash.  Neurological: Negative for dizziness, syncope, speech difficulty, weakness, numbness and headaches.  Hematological: Negative for adenopathy.  Psychiatric/Behavioral: Negative for  behavioral problems, dysphoric mood and agitation. The patient is not nervous/anxious.        Objective:   Physical Exam  Constitutional: She is oriented to person, place, and time. She appears well-developed and well-nourished.  HENT:  Head: Normocephalic.  Right Ear: External ear normal.  Left Ear: External ear normal.  Mouth/Throat: Oropharynx is clear and moist.  Eyes: Conjunctivae normal and EOM are normal. Pupils are equal, round, and reactive to light.  Neck: Normal range of motion. Neck supple. No thyromegaly present.  Cardiovascular: Normal rate, regular rhythm, normal heart sounds and intact distal pulses.   Pulmonary/Chest: Effort normal and breath sounds normal.  Abdominal: Soft. Bowel sounds are normal. She exhibits no mass. There is no tenderness.       Prominent aortic pulsation with bruit  Musculoskeletal: Normal range of motion.  Lymphadenopathy:    She has no cervical adenopathy.  Neurological: She is alert and oriented to person, place, and time.  Skin: Skin is warm and dry. No rash noted.  Psychiatric: She has a normal mood and affect. Her behavior is normal.          Assessment & Plan:   HTN-stable CAD PAD HLD  CPX 6 mon

## 2012-06-11 NOTE — Patient Instructions (Signed)
Limit your sodium (Salt) intake    It is important that you exercise regularly, at least 20 minutes 3 to 4 times per week.  If you develop chest pain or shortness of breath seek  medical attention.  Take a calcium supplement, plus 800-1200 units of vitamin D  Return in 6 months for follow-up  

## 2012-06-26 ENCOUNTER — Telehealth: Payer: Self-pay | Admitting: Internal Medicine

## 2012-06-26 ENCOUNTER — Telehealth: Payer: Self-pay | Admitting: Cardiovascular Disease

## 2012-06-26 NOTE — Telephone Encounter (Signed)
Pts daughter called called and said that [facility] on [address] has messed up pts script for pravastatin (PRAVACHOL) 40 MG tablet. They put Dr Harvie Bridge name on the script and it should have been Dr Amador Cunas for a 90 day supply. Pls call the pharmacy.

## 2012-06-26 NOTE — Telephone Encounter (Signed)
New Problem:    Patient's daughter called in because there has been some confusion about her mother's medicationa nd she would like to consult with you about it.  Please call back.

## 2012-06-26 NOTE — Telephone Encounter (Signed)
Busy number [phone]

## 2012-06-26 NOTE — Telephone Encounter (Signed)
Spoke with daughter, she was confused as to why we filled her Pravachol, her pcp fills it and her mother is used to seeing her pcp's name on the bottle and does a 90 day supply, told her to call pcp and have them refill, she agreed to plan.

## 2012-06-27 MED ORDER — PRAVASTATIN SODIUM 40 MG PO TABS: 40.0000 mg | ORAL_TABLET | Freq: Every day | ORAL | Status: DC

## 2012-06-27 NOTE — Telephone Encounter (Signed)
Pt notified Rx refill sent in to pharmacy with Dr. Vernon Prey name on it. Pt verbalized understanding.

## 2012-07-03 DIAGNOSIS — L259 Unspecified contact dermatitis, unspecified cause: Secondary | ICD-10-CM | POA: Diagnosis not present

## 2012-07-30 ENCOUNTER — Telehealth: Payer: Self-pay | Admitting: Cardiovascular Disease

## 2012-07-30 MED ORDER — METOPROLOL TARTRATE 25 MG PO TABS: 25.0000 mg | ORAL_TABLET | Freq: Two times a day (BID) | ORAL | Status: DC

## 2012-07-30 NOTE — Telephone Encounter (Signed)
Pt's dtr calling re pt's medication

## 2012-07-30 NOTE — Telephone Encounter (Signed)
WANTS 90 DAY REFILL/ COMPLETED

## 2012-08-14 DIAGNOSIS — L259 Unspecified contact dermatitis, unspecified cause: Secondary | ICD-10-CM | POA: Diagnosis not present

## 2012-09-27 ENCOUNTER — Encounter: Payer: Self-pay | Admitting: Neurosurgery

## 2012-09-28 ENCOUNTER — Ambulatory Visit: Payer: Medicare Other | Admitting: Neurosurgery

## 2012-10-04 ENCOUNTER — Encounter: Payer: Self-pay | Admitting: Neurosurgery

## 2012-10-05 ENCOUNTER — Ambulatory Visit: Payer: Medicare Other | Admitting: Neurosurgery

## 2012-10-05 ENCOUNTER — Encounter (INDEPENDENT_AMBULATORY_CARE_PROVIDER_SITE_OTHER): Payer: Medicare Other | Admitting: *Deleted

## 2012-10-05 DIAGNOSIS — I714 Abdominal aortic aneurysm, without rupture: Secondary | ICD-10-CM

## 2012-10-08 ENCOUNTER — Other Ambulatory Visit: Payer: Self-pay

## 2012-10-08 DIAGNOSIS — I714 Abdominal aortic aneurysm, without rupture: Secondary | ICD-10-CM

## 2012-10-09 ENCOUNTER — Encounter: Payer: Self-pay | Admitting: Vascular Surgery

## 2012-12-21 ENCOUNTER — Encounter: Payer: Self-pay | Admitting: Cardiovascular Disease

## 2012-12-26 ENCOUNTER — Encounter: Payer: Self-pay | Admitting: Cardiovascular Disease

## 2012-12-26 ENCOUNTER — Ambulatory Visit (INDEPENDENT_AMBULATORY_CARE_PROVIDER_SITE_OTHER): Payer: Medicare Other | Admitting: Cardiovascular Disease

## 2012-12-26 VITALS — BP 126/74 | HR 80 | Ht 64.5 in | Wt 114.8 lb

## 2012-12-26 DIAGNOSIS — E785 Hyperlipidemia, unspecified: Secondary | ICD-10-CM | POA: Diagnosis not present

## 2012-12-26 DIAGNOSIS — I2510 Atherosclerotic heart disease of native coronary artery without angina pectoris: Secondary | ICD-10-CM

## 2012-12-26 DIAGNOSIS — I714 Abdominal aortic aneurysm, without rupture, unspecified: Secondary | ICD-10-CM

## 2012-12-26 DIAGNOSIS — I10 Essential (primary) hypertension: Secondary | ICD-10-CM

## 2012-12-26 LAB — BASIC METABOLIC PANEL
Chloride: 105 mEq/L (ref 96–112)
Potassium: 3.9 mEq/L (ref 3.5–5.1)

## 2012-12-26 LAB — LIPID PANEL
Cholesterol: 136 mg/dL (ref 0–200)
LDL Cholesterol: 72 mg/dL (ref 0–99)
Triglycerides: 94 mg/dL (ref 0.0–149.0)
VLDL: 18.8 mg/dL (ref 0.0–40.0)

## 2012-12-26 LAB — HEPATIC FUNCTION PANEL
Albumin: 3.8 g/dL (ref 3.5–5.2)
Total Protein: 7.2 g/dL (ref 6.0–8.3)

## 2012-12-26 NOTE — Assessment & Plan Note (Signed)
She is asymptomatic.  No CP.  She is chronically short of breath. Will check labs today and again in 1 year at her next OV

## 2012-12-26 NOTE — Progress Notes (Signed)
[name] Date of Birth  02-Nov-1923 [facility]     [place] Office  [address]    [address]   [address] [place], [place]  [address]    [place], [place]  [address] [phone]  Fax  [phone]  [phone]  Fax [phone]  Problem List: 1. CAD 2. Abdominal Aortic aneurism 3.  Hyperlipidemia  History of Present Illness:  [name] has no complaints.  [name] did not take her meds this am.  [name] stopped smoking several months ago. As a result [name]'s gaining a little bit of weight. [name] has seen Dr. Imogene Burn for further evaluation of her abdominal aortic aneurysm. [name] has been getting follow up exams. [name] has not needed surgery for aneurysm yet.  December 26, 2012:  [name] is doing well.  No CP.  [name] has an AAA that is being observed.   Current Outpatient Prescriptions on File Prior to Visit  Medication Sig Dispense Refill  . amLODipine (NORVASC) 10 MG tablet Take 1 tablet (10 mg total) by mouth daily.  30 tablet  11  . clopidogrel (PLAVIX) 75 MG tablet Take 1 tablet (75 mg total) by mouth daily.  30 tablet  11  . hydrOXYzine (ATARAX/VISTARIL) 25 MG tablet as needed.      . IBUPROFEN PO Take by mouth as needed.        . metoprolol tartrate (LOPRESSOR) 25 MG tablet Take 1 tablet (25 mg total) by mouth 2 (two) times daily.  180 tablet  3  . Multiple Vitamin (MULTIVITAMIN PO) Take by mouth daily.        . nitroGLYCERIN (NITROSTAT) 0.4 MG SL tablet Place 1 tablet (0.4 mg total) under the tongue every 5 (five) minutes as needed.  25 tablet  11  . pravastatin (PRAVACHOL) 40 MG tablet Take 1 tablet (40 mg total) by mouth daily.  30 tablet  11   No current facility-administered medications on file prior to visit.    Allergies  Allergen Reactions  . Alendronate Sodium   . Chlordiazepoxide   . Librium   . Zocor (Simvastatin)     Leg cramps    Past Medical History  Diagnosis Date  . DJD (degenerative joint disease)   . CAD (coronary artery disease)   . Hypertension    . Hyperlipidemia   . Abdominal aortic aneurysm   . Osteopenia   . Aortic insufficiency   . Broken hip     left    Past Surgical History  Procedure Laterality Date  . Cataract extraction    . Abdominal hysterectomy    . Cholecystectomy    . Tonsillectomy    . Ptca    . Appendectomy    . Cardiac catheterization  2000    PTCA,and stenting to RCA  . Hip fracture surgery      Left hip ORIF    History  Smoking status  . Former Smoker -- 50 years  . Types: Cigarettes  . Quit date: 01/09/2011  Smokeless tobacco  . Never Used    History  Alcohol Use No    Family History  Problem Relation Age of Onset  . Coronary artery disease Mother   . Heart disease Mother   . Dementia Sister   . Heart disease Sister   . Coronary artery disease Brother   . Cancer Sister     Reviw of Systems:  Reviewed in the HPI.  All other systems are negative.  Physical Exam: Blood pressure 126/74,  pulse 80, height 5' 4.5" (1.638 m), weight 114 lb 12.8 oz (52.073 kg). General: Well developed, well nourished, in no acute distress.  Head: Normocephalic, atraumatic, sclera non-icteric, mucus membranes are moist,   Neck: Supple. Soft right carotid bruit. JVD not elevated.  Lungs: Clear bilaterally to auscultation without wheezes, rales, or rhonchi. Breathing is unlabored.  Heart: RRR with S1 S2.  There is a soft systolic murmur  Abdomen: Soft, non-tender, [name] has a moderately large pulsitile abdominal mass c/w AAA.  Soft bruit.  Msk:  Strength and tone appear normal for age.  Extremities: No clubbing or cyanosis. No edema.  Distal pedal pulses are 2+ and equal bilaterally.  Neuro: Alert and oriented X 3. Moves all extremities spontaneously.  Psych:  Responds to questions appropriately with a normal affect.  ECG: December 26, 2012:  Sinus rhythm at 80 with PACs.  No ST or T wave changes.  Assessment / Plan:

## 2012-12-26 NOTE — Assessment & Plan Note (Signed)
Continue current medications. 

## 2012-12-26 NOTE — Patient Instructions (Signed)
Your physician recommends that you return for a FASTING lipid profile: today and in 1 year   Your physician wants you to follow-up in: 1 year  You will receive a reminder letter in the mail two months in advance. If you don't receive a letter, please call our office to schedule the follow-up appointment.  Your physician recommends that you continue on your current medications as directed. Please refer to the Current Medication list given to you today.  

## 2012-12-26 NOTE — Assessment & Plan Note (Signed)
Followed by Dr. Imogene Burn.  Seems to be stable.

## 2013-03-15 DIAGNOSIS — Z961 Presence of intraocular lens: Secondary | ICD-10-CM | POA: Diagnosis not present

## 2013-03-15 DIAGNOSIS — H353190 Nonexudative age-related macular degeneration, unspecified eye, stage unspecified: Secondary | ICD-10-CM | POA: Diagnosis not present

## 2013-04-11 DIAGNOSIS — H353190 Nonexudative age-related macular degeneration, unspecified eye, stage unspecified: Secondary | ICD-10-CM | POA: Diagnosis not present

## 2013-04-11 DIAGNOSIS — Z961 Presence of intraocular lens: Secondary | ICD-10-CM | POA: Diagnosis not present

## 2013-04-11 DIAGNOSIS — H1045 Other chronic allergic conjunctivitis: Secondary | ICD-10-CM | POA: Diagnosis not present

## 2013-04-11 DIAGNOSIS — H26499 Other secondary cataract, unspecified eye: Secondary | ICD-10-CM | POA: Diagnosis not present

## 2013-04-11 DIAGNOSIS — H04129 Dry eye syndrome of unspecified lacrimal gland: Secondary | ICD-10-CM | POA: Diagnosis not present

## 2013-04-12 ENCOUNTER — Other Ambulatory Visit: Payer: Self-pay | Admitting: *Deleted

## 2013-04-12 ENCOUNTER — Ambulatory Visit: Payer: Medicare Other | Admitting: Vascular Surgery

## 2013-04-12 ENCOUNTER — Encounter (INDEPENDENT_AMBULATORY_CARE_PROVIDER_SITE_OTHER): Payer: Medicare Other | Admitting: Vascular Surgery

## 2013-04-12 DIAGNOSIS — I714 Abdominal aortic aneurysm, without rupture: Secondary | ICD-10-CM | POA: Diagnosis not present

## 2013-04-15 ENCOUNTER — Other Ambulatory Visit: Payer: Self-pay | Admitting: *Deleted

## 2013-04-15 DIAGNOSIS — I714 Abdominal aortic aneurysm, without rupture: Secondary | ICD-10-CM

## 2013-04-16 DIAGNOSIS — H353190 Nonexudative age-related macular degeneration, unspecified eye, stage unspecified: Secondary | ICD-10-CM | POA: Diagnosis not present

## 2013-04-16 LAB — HM DIABETES EYE EXAM

## 2013-04-16 NOTE — Addendum Note (Signed)
Addended by: Sharee Pimple on: 04/16/2013 10:33 AM   Modules accepted: Orders

## 2013-04-18 ENCOUNTER — Encounter: Payer: Self-pay | Admitting: Vascular Surgery

## 2013-04-23 ENCOUNTER — Encounter: Payer: Self-pay | Admitting: Internal Medicine

## 2013-07-12 ENCOUNTER — Other Ambulatory Visit: Payer: Self-pay | Admitting: Cardiovascular Disease

## 2013-08-19 ENCOUNTER — Encounter: Payer: Self-pay | Admitting: Internal Medicine

## 2013-08-19 ENCOUNTER — Ambulatory Visit (INDEPENDENT_AMBULATORY_CARE_PROVIDER_SITE_OTHER): Payer: Medicare Other | Admitting: Internal Medicine

## 2013-08-19 VITALS — BP 120/60 | HR 62 | Temp 98.3°F | Resp 20 | Ht 64.5 in | Wt 119.0 lb

## 2013-08-19 DIAGNOSIS — I2510 Atherosclerotic heart disease of native coronary artery without angina pectoris: Secondary | ICD-10-CM

## 2013-08-19 DIAGNOSIS — E785 Hyperlipidemia, unspecified: Secondary | ICD-10-CM | POA: Diagnosis not present

## 2013-08-19 DIAGNOSIS — I10 Essential (primary) hypertension: Secondary | ICD-10-CM

## 2013-08-19 MED ORDER — CEFUROXIME AXETIL 500 MG PO TABS: 500.0000 mg | ORAL_TABLET | Freq: Two times a day (BID) | ORAL | Status: DC

## 2013-08-19 NOTE — Progress Notes (Signed)
Pre-visit discussion using our clinic review tool. No additional management support is needed unless otherwise documented below in the visit note.  

## 2013-08-19 NOTE — Patient Instructions (Signed)
Take your antibiotic as prescribed until ALL of it is gone, but stop if you develop a rash, swelling, or any side effects of the medication.  Contact our office as soon as possible if  there are side effects of the medication.  Cellulitis Cellulitis is an infection of the skin and the tissue under the skin. The infected area is usually red and tender. This happens most often in the arms and lower legs. HOME CARE   Take your antibiotic medicine as told. Finish the medicine even if you start to feel better.  Keep the infected arm or leg raised (elevated).  Put a warm cloth on the area up to 4 times per day.  Only take medicines as told by your doctor.  Keep all doctor visits as told. GET HELP RIGHT AWAY IF:   You have a fever.  You feel very sleepy.  You throw up (vomit) or have watery poop (diarrhea).  You feel sick and have muscle aches and pains.  You see red streaks on the skin coming from the infected area.  Your red area gets bigger or turns a dark color.  Your bone or joint under the infected area is painful after the skin heals.  Your infection comes back in the same area or different area.  You have a puffy (swollen) bump in the infected area.  You have new symptoms. MAKE SURE YOU:   Understand these instructions.  Will watch your condition.  Will get help right away if you are not doing well or get worse. Document Released: 12/28/2007 Document Revised: 01/10/2012 Document Reviewed: 09/26/2011 [facility] Patient Information 2014 [place], [place].   Dermatology evaluation

## 2013-08-19 NOTE — Progress Notes (Signed)
Subjective:    Patient ID: [name], female    DOB: 1923-08-01, 80 y.o.   MRN: [id number]  HPI  80 year old patient who has a history of hypertension dyslipidemia and coronary artery disease. She presents with a several day history of swelling involving the left foot. There is minimal discomfort. No history of trauma or constitutional complaints. She has not been seen here in over one year but has been seen by cardiology.  Past Medical History  Diagnosis Date  . DJD (degenerative joint disease)   . CAD (coronary artery disease)   . Hypertension   . Hyperlipidemia   . Abdominal aortic aneurysm   . Osteopenia   . Aortic insufficiency   . Broken hip     left    History   Social History  . Marital Status: Widowed    Spouse Name: N/A    Number of Children: N/A  . Years of Education: N/A   Occupational History  . Not on file.   Social History Main Topics  . Smoking status: Former Smoker -- 50 years    Types: Cigarettes    Quit date: 01/09/2011  . Smokeless tobacco: Never Used  . Alcohol Use: No  . Drug Use: No  . Sexual Activity: Not on file   Other Topics Concern  . Not on file   Social History Narrative  . No narrative on file    Past Surgical History  Procedure Laterality Date  . Cataract extraction    . Abdominal hysterectomy    . Cholecystectomy    . Tonsillectomy    . Ptca    . Appendectomy    . Cardiac catheterization  2000    PTCA,and stenting to RCA  . Hip fracture surgery      Left hip ORIF    Family History  Problem Relation Age of Onset  . Coronary artery disease Mother   . Heart disease Mother   . Dementia Sister   . Heart disease Sister   . Coronary artery disease Brother   . Cancer Sister     Allergies  Allergen Reactions  . Alendronate Sodium   . Chlordiazepoxide   . Librium   . Zocor [Simvastatin]     Leg cramps    Current Outpatient Prescriptions on File Prior to Visit  Medication Sig Dispense Refill  . amLODipine  (NORVASC) 10 MG tablet TAKE ONE TABLET BY MOUTH EVERY DAY  30 tablet  5  . clopidogrel (PLAVIX) 75 MG tablet TAKE ONE TABLET BY MOUTH EVERY DAY  30 tablet  5  . hydrOXYzine (ATARAX/VISTARIL) 25 MG tablet Take 25 mg by mouth as needed.       . IBUPROFEN PO Take by mouth as needed.        . metoprolol tartrate (LOPRESSOR) 25 MG tablet Take 1 tablet (25 mg total) by mouth 2 (two) times daily.  180 tablet  3  . Multiple Vitamin (MULTIVITAMIN PO) Take by mouth daily.        . nitroGLYCERIN (NITROSTAT) 0.4 MG SL tablet Place 1 tablet (0.4 mg total) under the tongue every 5 (five) minutes as needed.  25 tablet  11  . pravastatin (PRAVACHOL) 40 MG tablet TAKE ONE TABLET BY MOUTH EVERY DAY (NEEDS TO BE SEEN)  30 tablet  5   No current facility-administered medications on file prior to visit.    BP 120/60  Pulse 62  Temp(Src) 98.3 F (36.8 C) (Oral)  Resp 20  Ht 5'  4.5" (1.638 m)  Wt 119 lb (53.978 kg)  BMI 20.12 kg/m2  SpO2 96%       Review of Systems  Constitutional: Negative.   HENT: Negative for congestion, dental problem, hearing loss, rhinorrhea, sinus pressure, sore throat and tinnitus.   Eyes: Negative for pain, discharge and visual disturbance.  Respiratory: Negative for cough and shortness of breath.   Cardiovascular: Positive for leg swelling. Negative for chest pain and palpitations.  Gastrointestinal: Negative for nausea, vomiting, abdominal pain, diarrhea, constipation, blood in stool and abdominal distention.  Genitourinary: Negative for dysuria, urgency, frequency, hematuria, flank pain, vaginal bleeding, vaginal discharge, difficulty urinating, vaginal pain and pelvic pain.  Musculoskeletal: Negative for arthralgias, gait problem and joint swelling.  Skin: Positive for wound. Negative for rash.       Nonhealing lesion of the nose  Neurological: Negative for dizziness, syncope, speech difficulty, weakness, numbness and headaches.  Hematological: Negative for adenopathy.    Psychiatric/Behavioral: Negative for behavioral problems, dysphoric mood and agitation. The patient is not nervous/anxious.        Objective:   Physical Exam  Constitutional: She appears well-nourished. No distress.  Blood pressure 112/62  Musculoskeletal: She exhibits edema.  Considerable dryness erythema and warmth was noted involving the left lateral ankle and proximal lateral foot. Considerable soft tissue swelling is noted over the dorsal aspect of the foot  Skin:  5 mm crusted lesion involving the bridge of the nose. 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 Patient will followup with dermatology

## 2013-08-28 ENCOUNTER — Telehealth: Payer: Self-pay | Admitting: Internal Medicine

## 2013-08-28 NOTE — Telephone Encounter (Signed)
Relevant patient education mailed to patient.  

## 2013-08-30 DIAGNOSIS — L0390 Cellulitis, unspecified: Secondary | ICD-10-CM | POA: Diagnosis not present

## 2013-08-30 DIAGNOSIS — D485 Neoplasm of uncertain behavior of skin: Secondary | ICD-10-CM | POA: Diagnosis not present

## 2013-08-30 DIAGNOSIS — L570 Actinic keratosis: Secondary | ICD-10-CM | POA: Diagnosis not present

## 2013-08-30 DIAGNOSIS — L408 Other psoriasis: Secondary | ICD-10-CM | POA: Diagnosis not present

## 2013-08-30 DIAGNOSIS — L0291 Cutaneous abscess, unspecified: Secondary | ICD-10-CM | POA: Diagnosis not present

## 2013-10-02 DIAGNOSIS — L408 Other psoriasis: Secondary | ICD-10-CM | POA: Diagnosis not present

## 2013-10-15 DIAGNOSIS — H353190 Nonexudative age-related macular degeneration, unspecified eye, stage unspecified: Secondary | ICD-10-CM | POA: Diagnosis not present

## 2013-10-15 DIAGNOSIS — Z961 Presence of intraocular lens: Secondary | ICD-10-CM | POA: Diagnosis not present

## 2013-10-17 ENCOUNTER — Encounter: Payer: Self-pay | Admitting: Family

## 2013-10-18 ENCOUNTER — Ambulatory Visit (HOSPITAL_COMMUNITY)
Admission: RE | Admit: 2013-10-18 | Discharge: 2013-10-18 | Disposition: A | Discharge status: Home / Self Care | Payer: Medicare Other | Source: Ambulatory Visit | Attending: Family | Admitting: Family

## 2013-10-18 ENCOUNTER — Encounter: Payer: Self-pay | Admitting: Family

## 2013-10-18 ENCOUNTER — Ambulatory Visit (INDEPENDENT_AMBULATORY_CARE_PROVIDER_SITE_OTHER): Payer: Medicare Other | Admitting: Family

## 2013-10-18 VITALS — BP 111/66 | HR 57 | Resp 14 | Ht 64.0 in | Wt 115.0 lb

## 2013-10-18 DIAGNOSIS — I714 Abdominal aortic aneurysm, without rupture, unspecified: Secondary | ICD-10-CM

## 2013-10-18 DIAGNOSIS — I6529 Occlusion and stenosis of unspecified carotid artery: Secondary | ICD-10-CM

## 2013-10-18 NOTE — Progress Notes (Signed)
[facility] [place]  Established Abdominal Aortic Aneurysm  History of Present Illness  [name] is a 80 y.o. (1924/03/26) female patient of Dr. Bridgett Larsson followed for known AAA, who presents with chief complaint: follow up for AAA.  Previous studies demonstrate an AAA, measuring 4.63 cm.  The patient does not have back or abdominal pain.  The patient is not a smoker. The patient denies claudication in legs with walking. The patient reports history of TIA symptoms in 2013 and 2014, both manifested as transient expressive aphasia, no hemiparesis, no monocular blindness. No carotid Duplex result on file, Dr. Acie Fredrickson is her cardiologist, has a cardiac stent. Plavix prescribed by Dr. Acie Fredrickson, since her TIA, per daughter.  Pt Diabetic: No Pt smoker: former smoker, quit in 2012  Past Medical History  Diagnosis Date  . DJD (degenerative joint disease)   . CAD (coronary artery disease)   . Hypertension   . Hyperlipidemia   . Abdominal aortic aneurysm   . Osteopenia   . Aortic insufficiency   . Broken hip     left  . Stroke 2013   Past Surgical History  Procedure Laterality Date  . Cataract extraction    . Abdominal hysterectomy    . Cholecystectomy    . Tonsillectomy    . Ptca    . Appendectomy    . Cardiac catheterization  2000    PTCA,and stenting to RCA  . Hip fracture surgery      Left hip ORIF  . Fracture surgery Left   . Eye surgery     Social History History   Social History  . Marital Status: Widowed    Spouse Name: N/A    Number of Children: N/A  . Years of Education: N/A   Occupational History  . Not on file.   Social History Main Topics  . Smoking status: Former Smoker -- 50 years    Types: Cigarettes    Quit date: 01/09/2011  . Smokeless tobacco: Never Used  . Alcohol Use: No  . Drug Use: No  . Sexual Activity: Not on file   Other Topics Concern  . Not on file   Social History Narrative  . No narrative on file   Family  History Family History  Problem Relation Age of Onset  . Coronary artery disease Mother   . Heart disease Mother   . Dementia Sister   . Heart disease Sister   . Coronary artery disease Brother   . Cancer Sister     Current Outpatient Prescriptions on File Prior to Visit  Medication Sig Dispense Refill  . amLODipine (NORVASC) 10 MG tablet TAKE ONE TABLET BY MOUTH EVERY DAY  30 tablet  5  . clopidogrel (PLAVIX) 75 MG tablet TAKE ONE TABLET BY MOUTH EVERY DAY  30 tablet  5  . IBUPROFEN PO Take by mouth as needed.        . metoprolol tartrate (LOPRESSOR) 25 MG tablet Take 1 tablet (25 mg total) by mouth 2 (two) times daily.  180 tablet  3  . Multiple Vitamin (MULTIVITAMIN PO) Take by mouth daily.        . nitroGLYCERIN (NITROSTAT) 0.4 MG SL tablet Place 1 tablet (0.4 mg total) under the tongue every 5 (five) minutes as needed.  25 tablet  11  . pravastatin (PRAVACHOL) 40 MG tablet TAKE ONE TABLET BY MOUTH EVERY DAY (NEEDS TO BE SEEN)  30 tablet  5  . cefUROXime (CEFTIN) 500 MG tablet  Take 1 tablet (500 mg total) by mouth 2 (two) times daily with a meal.  20 tablet  0  . hydrOXYzine (ATARAX/VISTARIL) 25 MG tablet Take 25 mg by mouth as needed.        No current facility-administered medications on file prior to visit.   Allergies  Allergen Reactions  . Alendronate Sodium   . Chlordiazepoxide   . Librium   . Zocor [Simvastatin]     Leg cramps    ROS: See HPI for pertinent positives and negatives.  Physical Examination  Filed Vitals:   10/18/13 0913  BP: 111/66  Pulse: 57  Resp: 14   Filed Weights   10/18/13 0913  Weight: 115 lb (52.164 kg)   Body mass index is 19.73 kg/(m^2).  General: A&O x 3, WD, .  Pulmonary: Sym exp, good air movt, CTAB, no rales, rhonchi, or wheezing.  Cardiac: RRR, Nl S1, S2, no detected murmur.   Carotid Bruits Left Right   Negative Negative   Aorta is  palpable Radial pulses are 2+ palpable and =                          VASCULAR  EXAM:                                                                                                         LE Pulses LEFT RIGHT       POPLITEAL  not palpable   not palpable       POSTERIOR TIBIAL   palpable    palpable        DORSALIS PEDIS      ANTERIOR TIBIAL  palpable   palpable      Gastrointestinal: soft, NTND, -G/R, - HSM, - masses, - CVAT B.  Musculoskeletal: M/S 4/5 throughout, Extremities without ischemic changes.  Neurologic: CN 2-12 intact  except has some hearing loss, Pain and light touch intact in extremities are intact except, Motor exam as listed above.  Non-Invasive Vascular Imaging  AAA Duplex (10/18/2013)  Previous size: 4.63 cm (Date: 04/12/2013)  Current size:  4.78 cm (Date: 10/18/2013)  A year ago: 4.55 cm (10/05/2012)  Medical Decision Making  The patient is a 80 y.o. female who presents with asymptomatic AAA with slight increase in size; increased 0.23 cm in a year.   Based on this patient's exam and diagnostic studies, the patient will follow up in 6 months  with the following studies: AAA Duplex and carotid Duplex since she has had TIA's in 2013 and 2014, no carotid Duplex results on file.  Consideration for repair of AAA would be made when the size approaches 4.8 or 5.0 cm, growth > 1 cm/yr, and symptomatic status.  I emphasized the importance of maximal medical management including strict control of blood pressure, blood glucose, and lipid levels, antiplatelet agents, obtaining regular exercise, and continued cessation of smoking.   The patient was given information about AAA including signs, symptoms, treatment, and how to minimize the risk of enlargement and rupture of aneurysms.  The patient was advised to call 911 should the patient experience sudden onset abdominal or back pain.   Thank you for allowing [place] to participate in this patient's care.  Clemon Chambers, RN, MSN, FNP-C [facility] [place] Office:  [phone]  Clinic Physician: Bridgett Larsson  10/18/2013, 9:14 AM

## 2013-10-18 NOTE — Patient Instructions (Addendum)
Abdominal Aortic Aneurysm An aneurysm is a weakened or damaged part of an artery wall that bulges from the normal force of blood pumping through the body. An abdominal aortic aneurysm is an aneurysm that occurs in the lower part of the aorta, the main artery of the body.  The major concern with an abdominal aortic aneurysm is that it can enlarge and burst (rupture) or blood can flow between the layers of the wall of the aorta through a tear (aorticdissection). Both of these conditions can cause bleeding inside the body and can be life threatening unless diagnosed and treated promptly. CAUSES  The exact cause of an abdominal aortic aneurysm is unknown. Some contributing factors are:   A hardening of the arteries caused by the buildup of fat and other substances in the lining of a blood vessel (arteriosclerosis).  Inflammation of the walls of an artery (arteritis).   Connective tissue diseases, such as Marfan syndrome.   Abdominal trauma.   An infection, such as syphilis or staphylococcus, in the wall of the aorta (infectious aortitis) caused by bacteria. RISK FACTORS  Risk factors that contribute to an abdominal aortic aneurysm may include:  Age older than 60 years.   High blood pressure (hypertension).  Female gender.  Ethnicity (white race).  Obesity.  Family history of aneurysm (first degree relatives only).  Tobacco use. PREVENTION  The following healthy lifestyle habits may help decrease your risk of abdominal aortic aneurysm:  Quitting smoking. Smoking can raise your blood pressure and cause arteriosclerosis.  Limiting or avoiding alcohol.  Keeping your blood pressure, blood sugar level, and cholesterol levels within normal limits.  Decreasing your salt intake. In somepeople, too much salt can raise blood pressure and increase your risk of abdominal aortic aneurysm.  Eating a diet low in saturated fats and cholesterol.  Increasing your fiber intake by including  whole grains, vegetables, and fruits in your diet. Eating these foods may help lower blood pressure.  Maintaining a healthy weight.  Staying physically active and exercising regularly. SYMPTOMS  The symptoms of abdominal aortic aneurysm may vary depending on the size and rate of growth of the aneurysm.Most grow slowly and do not have any symptoms. When symptoms do occur, they may include:  Pain (abdomen, side, lower back, or groin). The pain may vary in intensity. A sudden onset of severe pain may indicate that the aneurysm has ruptured.  Feeling full after eating only small amounts of food.  Nausea or vomiting or both.  Feeling a pulsating lump in the abdomen.  Feeling faint or passing out. DIAGNOSIS  Since most unruptured abdominal aortic aneurysms have no symptoms, they are often discovered during diagnostic exams for other conditions. An aneurysm may be found during the following procedures:  Ultrasonography (A one-time screening for abdominal aortic aneurysm by ultrasonography is also recommended for all men aged 65-75 years who have ever smoked).  X-ray exams.  A computed tomography (CT).  Magnetic resonance imaging (MRI).  Angiography or arteriography. TREATMENT  Treatment of an abdominal aortic aneurysm depends on the size of your aneurysm, your age, and risk factors for rupture. Medication to control blood pressure and pain may be used to manage aneurysms smaller than 6 cm. Regular monitoring for enlargement may be recommended by your caregiver if:  The aneurysm is 3 4 cm in size (an annual ultrasonography may be recommended).  The aneurysm is 4 4.5 cm in size (an ultrasonography every 6 months may be recommended).  The aneurysm is larger than 4.5   cm in size (your caregiver may ask that you be examined by a vascular surgeon). If your aneurysm is larger than 6 cm, surgical repair may be recommended. There are two main methods for repair of an aneurysm:   Endovascular  repair (a minimally invasive surgery). This is done most often.  Open repair. This method is used if an endovascular repair is not possible. Document Released: 04/20/2005 Document Revised: 11/05/2012 Document Reviewed: 08/10/2012 [facility] Patient Information 2014 [place], [place].   Stroke Prevention Some medical conditions and behaviors are associated with an increased chance of having a stroke. You may prevent a stroke by making healthy choices and managing medical conditions. HOW CAN I REDUCE MY RISK OF HAVING A STROKE?   Stay physically active. Get at least 30 minutes of activity on most or all days.  Do not smoke. It may also be helpful to avoid exposure to secondhand smoke.  Limit alcohol use. Moderate alcohol use is considered to be:  No more than 2 drinks per day for men.  No more than 1 drink per day for nonpregnant women.  Eat healthy foods. This involves  Eating 5 or more servings of fruits and vegetables a day.  Following a diet that addresses high blood pressure (hypertension), high cholesterol, diabetes, or obesity.  Manage your cholesterol levels.  A diet low in saturated fat, trans fat, and cholesterol and high in fiber may control cholesterol levels.  Take any prescribed medicines to control cholesterol as directed by your health care provider.  Manage your diabetes.  A controlled-carbohydrate, controlled-sugar diet is recommended to manage diabetes.  Take any prescribed medicines to control diabetes as directed by your health care provider.  Control your hypertension.  A low-salt (sodium), low-saturated fat, low-trans fat, and low-cholesterol diet is recommended to manage hypertension.  Take any prescribed medicines to control hypertension as directed by your health care provider.  Maintain a healthy weight.  A reduced-calorie, low-sodium, low-saturated fat, low-trans fat, low-cholesterol diet is recommended to manage weight.  Stop drug  abuse.  Avoid taking birth control pills.  Talk to your health care provider about the risks of taking birth control pills if you are over 82 years old, smoke, get migraines, or have ever had a blood clot.  Get evaluated for sleep disorders (sleep apnea).  Talk to your health care provider about getting a sleep evaluation if you snore a lot or have excessive sleepiness.  Take medicines as directed by your health care provider.  For some people, aspirin or blood thinners (anticoagulants) are helpful in reducing the risk of forming abnormal blood clots that can lead to stroke. If you have the irregular heart rhythm of atrial fibrillation, you should be on a blood thinner unless there is a good reason you cannot take them.  Understand all your medicine instructions.  Make sure that other other conditions (such as anemia or atherosclerosis) are addressed. SEEK IMMEDIATE MEDICAL CARE IF:   You have sudden weakness or numbness of the face, arm, or leg, especially on one side of the body.  Your face or eyelid droops to one side.  You have sudden confusion.  You have trouble speaking (aphasia) or understanding.  You have sudden trouble seeing in one or both eyes.  You have sudden trouble walking.  You have dizziness.  You have a loss of balance or coordination.  You have a sudden, severe headache with no known cause.  You have new chest pain or an irregular heartbeat. Any of these symptoms  may represent a serious problem that is an emergency. Do not wait to see if the symptoms will go away. Get medical help at once. Call your local emergency services  (911 in [place] Do not drive yourself to the hospital. Document Released: 08/18/2004 Document Revised: 05/01/2013 Document Reviewed: 01/11/2013 [facility] Patient Information 2014 [place].

## 2013-10-21 NOTE — Addendum Note (Signed)
Addended by: Mena Goes on: 10/21/2013 05:29 PM   Modules accepted: Orders

## 2013-11-02 ENCOUNTER — Other Ambulatory Visit: Payer: Self-pay | Admitting: Cardiovascular Disease

## 2013-11-29 DIAGNOSIS — L821 Other seborrheic keratosis: Secondary | ICD-10-CM | POA: Diagnosis not present

## 2013-11-29 DIAGNOSIS — L408 Other psoriasis: Secondary | ICD-10-CM | POA: Diagnosis not present

## 2013-12-23 ENCOUNTER — Ambulatory Visit (INDEPENDENT_AMBULATORY_CARE_PROVIDER_SITE_OTHER): Payer: Medicare Other | Admitting: *Deleted

## 2013-12-23 DIAGNOSIS — I10 Essential (primary) hypertension: Secondary | ICD-10-CM | POA: Diagnosis not present

## 2013-12-23 DIAGNOSIS — I2510 Atherosclerotic heart disease of native coronary artery without angina pectoris: Secondary | ICD-10-CM | POA: Diagnosis not present

## 2013-12-23 DIAGNOSIS — E785 Hyperlipidemia, unspecified: Secondary | ICD-10-CM | POA: Diagnosis not present

## 2013-12-23 LAB — HEPATIC FUNCTION PANEL
ALBUMIN: 4 g/dL (ref 3.5–5.2)
ALT: 12 U/L (ref 0–35)
AST: 15 U/L (ref 0–37)
Alkaline Phosphatase: 69 U/L (ref 39–117)
Bilirubin, Direct: 0.1 mg/dL (ref 0.0–0.3)
Total Bilirubin: 0.6 mg/dL (ref 0.2–1.2)
Total Protein: 7 g/dL (ref 6.0–8.3)

## 2013-12-23 LAB — LIPID PANEL
CHOL/HDL RATIO: 3
CHOLESTEROL: 150 mg/dL (ref 0–200)
HDL: 57.2 mg/dL (ref >39.00)
LDL CALC: 74 mg/dL (ref 0–99)
Triglycerides: 92 mg/dL (ref 0.0–149.0)
VLDL: 18.4 mg/dL (ref 0.0–40.0)

## 2013-12-23 LAB — BASIC METABOLIC PANEL
BUN: 12 mg/dL (ref 6–23)
CHLORIDE: 103 meq/L (ref 96–112)
CO2: 27 meq/L (ref 19–32)
Calcium: 9.3 mg/dL (ref 8.4–10.5)
Creatinine, Ser: 1.3 mg/dL — ABNORMAL HIGH (ref 0.4–1.2)
GFR: 41.83 mL/min — ABNORMAL LOW (ref >60.00)
Glucose, Bld: 92 mg/dL (ref 70–99)
Potassium: 4.4 mEq/L (ref 3.5–5.1)
Sodium: 138 mEq/L (ref 135–145)

## 2013-12-27 ENCOUNTER — Ambulatory Visit (INDEPENDENT_AMBULATORY_CARE_PROVIDER_SITE_OTHER): Payer: Medicare Other | Admitting: Cardiovascular Disease

## 2013-12-27 ENCOUNTER — Encounter: Payer: Self-pay | Admitting: Cardiovascular Disease

## 2013-12-27 VITALS — BP 130/72 | HR 58 | Ht 64.0 in | Wt 117.6 lb

## 2013-12-27 DIAGNOSIS — I714 Abdominal aortic aneurysm, without rupture, unspecified: Secondary | ICD-10-CM | POA: Diagnosis not present

## 2013-12-27 DIAGNOSIS — I359 Nonrheumatic aortic valve disorder, unspecified: Secondary | ICD-10-CM

## 2013-12-27 DIAGNOSIS — I351 Nonrheumatic aortic (valve) insufficiency: Secondary | ICD-10-CM

## 2013-12-27 DIAGNOSIS — I2510 Atherosclerotic heart disease of native coronary artery without angina pectoris: Secondary | ICD-10-CM | POA: Diagnosis not present

## 2013-12-27 DIAGNOSIS — I10 Essential (primary) hypertension: Secondary | ICD-10-CM

## 2013-12-27 NOTE — Progress Notes (Signed)
[name] Date of Birth  29-Jun-1924 [name] [address]    [place]   [place], [place]  [address]    [place], [place]  [address] [phone]  Fax  [phone]  [phone]  Fax [phone]  Problem List: 1. CAD 2. Abdominal Aortic aneurism 3.  Hyperlipidemia  History of Present Illness:  She has no complaints.  She did not take her meds this am.  She stopped smoking several months ago. As a result she's gaining a little bit of weight. She has seen Dr. Bridgett Larsson for further evaluation of her abdominal aortic aneurysm. She has been getting follow up exams. She has not needed surgery for aneurysm yet.  December 26, 2012:  [name] is doing well.  No CP.  She has an AAA that is being observed.   December 27, 2013:  [name] is doing ok from a cardiac standpoint.  She is losing her eyesight. No , no dyspnea.  Her AAA is slowly enlarging.  Followed by Dr. Bridgett Larsson.      Current Outpatient Prescriptions on File Prior to Visit  Medication Sig Dispense Refill  . amLODipine (NORVASC) 10 MG tablet TAKE ONE TABLET BY MOUTH EVERY DAY  30 tablet  5  . cefUROXime (CEFTIN) 500 MG tablet Take 1 tablet (500 mg total) by mouth 2 (two) times daily with a meal.  20 tablet  0  . clopidogrel (PLAVIX) 75 MG tablet TAKE ONE TABLET BY MOUTH EVERY DAY  30 tablet  5  . hydrOXYzine (ATARAX/VISTARIL) 25 MG tablet Take 25 mg by mouth as needed.       . IBUPROFEN PO Take by mouth as needed.        . metoprolol tartrate (LOPRESSOR) 25 MG tablet TAKE ONE TABLET BY MOUTH TWICE DAILY  180 tablet  0  . Multiple Vitamin (MULTIVITAMIN PO) Take by mouth daily.        . Multiple Vitamins-Minerals (PRESERVISION AREDS 2 PO) Take by mouth 2 (two) times daily.      . nitroGLYCERIN (NITROSTAT) 0.4 MG SL tablet Place 1 tablet (0.4 mg total) under the tongue every 5 (five) minutes as needed.  25 tablet  11  . pravastatin (PRAVACHOL) 40 MG tablet TAKE ONE TABLET BY MOUTH EVERY  DAY (NEEDS TO BE SEEN)  30 tablet  5  . triamcinolone ointment (KENALOG) 0.1 % as needed.       No current facility-administered medications on file prior to visit.    Allergies  Allergen Reactions  . Alendronate Sodium   . Chlordiazepoxide   . Librium   . Zocor [Simvastatin]     Leg cramps    Past Medical History  Diagnosis Date  . DJD (degenerative joint disease)   . CAD (coronary artery disease)   . Hypertension   . Hyperlipidemia   . Abdominal aortic aneurysm   . Osteopenia   . Aortic insufficiency   . Broken hip     left  . Stroke 2013    Past Surgical History  Procedure Laterality Date  . Cataract extraction    . Abdominal hysterectomy    . Cholecystectomy    . Tonsillectomy    . Ptca    . Appendectomy    . Cardiac catheterization  2000    PTCA,and stenting to RCA  . Hip fracture surgery      Left hip ORIF  . Fracture surgery Left   .  Eye surgery      History  Smoking status  . Former Smoker -- 56 years  . Types: Cigarettes  . Quit date: 01/09/2011  Smokeless tobacco  . Never Used    History  Alcohol Use No    Family History  Problem Relation Age of Onset  . Coronary artery disease Mother   . Heart disease Mother   . Dementia Sister   . Heart disease Sister   . Coronary artery disease Brother   . Cancer Sister     Reviw of Systems:  Reviewed in the HPI.  All other systems are negative.  Physical Exam: Blood pressure 130/72, pulse 58, height 5\' 4"  (1.626 m), weight 117 lb 9.6 oz (53.343 kg). General: Well developed, well nourished, in no acute distress.  Head: Normocephalic, atraumatic, sclera non-icteric, mucus membranes are moist,   Neck: Supple. Soft right carotid bruit. JVD not elevated.  Lungs: Clear bilaterally to auscultation without wheezes, rales, or rhonchi. Breathing is unlabored.  Heart: RRR with S1 S2.  There is a soft systolic murmur  Abdomen: Soft, non-tender, She has a moderately large pulsitile abdominal mass c/w  AAA.  Soft bruit.  Msk:  Strength and tone appear normal for age.  Extremities: No clubbing or cyanosis. No edema.  Distal pedal pulses are 2+ and equal bilaterally.  Neuro: Alert and oriented X 3. Moves all extremities spontaneously.  Psych:  Responds to questions appropriately with a normal affect.  ECG: December 26, 2012:  Sinus rhythm at 80 with PACs.  No ST or T wave changes.  Assessment / Plan:

## 2013-12-27 NOTE — Assessment & Plan Note (Signed)
Her AAA is palpable.  Followed by [facility]

## 2013-12-27 NOTE — Assessment & Plan Note (Signed)
Stable.  No angina  

## 2013-12-27 NOTE — Patient Instructions (Signed)
Your physician recommends that you continue on your current medications as directed. Please refer to the Current Medication list given to you today.  Your physician wants you to follow-up in: 6 months with Dr. Nahser.  You will receive a reminder letter in the mail two months in advance. If you don't receive a letter, please call our office to schedule the follow-up appointment.  

## 2014-01-09 DIAGNOSIS — L408 Other psoriasis: Secondary | ICD-10-CM | POA: Diagnosis not present

## 2014-01-09 DIAGNOSIS — D692 Other nonthrombocytopenic purpura: Secondary | ICD-10-CM | POA: Diagnosis not present

## 2014-02-15 ENCOUNTER — Other Ambulatory Visit: Payer: Self-pay | Admitting: Cardiovascular Disease

## 2014-02-18 ENCOUNTER — Encounter: Payer: Medicare Other | Admitting: Internal Medicine

## 2014-02-26 ENCOUNTER — Encounter: Payer: Self-pay | Admitting: Internal Medicine

## 2014-02-26 ENCOUNTER — Ambulatory Visit (INDEPENDENT_AMBULATORY_CARE_PROVIDER_SITE_OTHER): Payer: Medicare Other | Admitting: Internal Medicine

## 2014-02-26 VITALS — BP 120/70 | HR 57 | Temp 97.9°F | Resp 18 | Ht 62.5 in | Wt 117.0 lb

## 2014-02-26 DIAGNOSIS — Z23 Encounter for immunization: Secondary | ICD-10-CM

## 2014-02-26 DIAGNOSIS — E785 Hyperlipidemia, unspecified: Secondary | ICD-10-CM | POA: Diagnosis not present

## 2014-02-26 DIAGNOSIS — I2510 Atherosclerotic heart disease of native coronary artery without angina pectoris: Secondary | ICD-10-CM | POA: Diagnosis not present

## 2014-02-26 DIAGNOSIS — I714 Abdominal aortic aneurysm, without rupture, unspecified: Secondary | ICD-10-CM

## 2014-02-26 DIAGNOSIS — I359 Nonrheumatic aortic valve disorder, unspecified: Secondary | ICD-10-CM

## 2014-02-26 DIAGNOSIS — Z0000 Encounter for general adult medical examination without abnormal findings: Secondary | ICD-10-CM

## 2014-02-26 DIAGNOSIS — I10 Essential (primary) hypertension: Secondary | ICD-10-CM | POA: Diagnosis not present

## 2014-02-26 DIAGNOSIS — I351 Nonrheumatic aortic (valve) insufficiency: Secondary | ICD-10-CM

## 2014-02-26 LAB — CBC WITH DIFFERENTIAL/PLATELET
BASOS PCT: 0.9 % (ref 0.0–3.0)
Basophils Absolute: 0.1 10*3/uL (ref 0.0–0.1)
EOS PCT: 11.1 % — AB (ref 0.0–5.0)
Eosinophils Absolute: 0.8 10*3/uL — ABNORMAL HIGH (ref 0.0–0.7)
HCT: 42.1 % (ref 36.0–46.0)
Hemoglobin: 14.1 g/dL (ref 12.0–15.0)
Lymphocytes Relative: 12.5 % (ref 12.0–46.0)
Lymphs Abs: 0.9 10*3/uL (ref 0.7–4.0)
MCHC: 33.5 g/dL (ref 30.0–36.0)
MCV: 88.8 fl (ref 78.0–100.0)
MONO ABS: 0.7 10*3/uL (ref 0.1–1.0)
Monocytes Relative: 10.6 % (ref 3.0–12.0)
Neutro Abs: 4.5 10*3/uL (ref 1.4–7.7)
Neutrophils Relative %: 64.9 % (ref 43.0–77.0)
PLATELETS: 210 10*3/uL (ref 150.0–400.0)
RBC: 4.74 Mil/uL (ref 3.87–5.11)
RDW: 14.5 % (ref 11.5–15.5)
WBC: 6.9 10*3/uL (ref 4.0–10.5)

## 2014-02-26 LAB — TSH: TSH: 2.66 u[IU]/mL (ref 0.35–4.50)

## 2014-02-26 NOTE — Patient Instructions (Addendum)
Limit your sodium (Salt) intake    It is important that you exercise regularly, at least 20 minutes 3 to 4 times per week.  If you develop chest pain or shortness of breath seek  medical attention. 

## 2014-02-26 NOTE — Progress Notes (Signed)
Subjective:    Patient ID: [name], female    DOB: August 10, 1923, 80 y.o.   MRN: [id number]  HPI 80 year old patient who is seen today for a preventive health examination.  She has coronary artery disease and is followed by cardiology. She remained stable. She is also followed by vascular surgery for AAA which has been stable. She denies any cardiopulmonary complaints. She has dyslipidemia and remains on pravastatin which she continues to tolerate well.   Past Medical History  Diagnosis Date  . DJD (degenerative joint disease)   . CAD (coronary artery disease)   . Hypertension   . Hyperlipidemia   . Abdominal aortic aneurysm   . Osteopenia   . Aortic insufficiency   . Broken hip     left  . Stroke 2013    History   Social History  . Marital Status: Widowed    Spouse Name: N/A    Number of Children: N/A  . Years of Education: N/A   Occupational History  . Not on file.   Social History Main Topics  . Smoking status: Former Smoker -- 50 years    Types: Cigarettes    Quit date: 01/09/2011  . Smokeless tobacco: Never Used  . Alcohol Use: No  . Drug Use: No  . Sexual Activity: Not on file   Other Topics Concern  . Not on file   Social History Narrative  . No narrative on file    Past Surgical History  Procedure Laterality Date  . Cataract extraction    . Abdominal hysterectomy    . Cholecystectomy    . Tonsillectomy    . Ptca    . Appendectomy    . Cardiac catheterization  2000    PTCA,and stenting to RCA  . Hip fracture surgery      Left hip ORIF  . Fracture surgery Left   . Eye surgery      Family History  Problem Relation Age of Onset  . Coronary artery disease Mother   . Heart disease Mother   . Dementia Sister   . Heart disease Sister   . Coronary artery disease Brother   . Cancer Sister     Allergies  Allergen Reactions  . Alendronate Sodium   . Chlordiazepoxide   . Librium   . Zocor [Simvastatin]     Leg cramps    Current  Outpatient Prescriptions on File Prior to Visit  Medication Sig Dispense Refill  . amLODipine (NORVASC) 10 MG tablet TAKE ONE TABLET BY MOUTH ONCE DAILY  30 tablet  6  . cefUROXime (CEFTIN) 500 MG tablet Take 1 tablet (500 mg total) by mouth 2 (two) times daily with a meal.  20 tablet  0  . clobetasol cream (TEMOVATE) 0.05 %       . clopidogrel (PLAVIX) 75 MG tablet TAKE ONE TABLET BY MOUTH ONCE DAILY  30 tablet  6  . hydrOXYzine (ATARAX/VISTARIL) 25 MG tablet Take 25 mg by mouth as needed.       . IBUPROFEN PO Take by mouth as needed.        . metoprolol tartrate (LOPRESSOR) 25 MG tablet TAKE ONE TABLET BY MOUTH TWICE DAILY  180 tablet  0  . Multiple Vitamin (MULTIVITAMIN PO) Take by mouth daily.        . Multiple Vitamins-Minerals (PRESERVISION AREDS 2 PO) Take by mouth 2 (two) times daily.      . nitroGLYCERIN (NITROSTAT) 0.4 MG SL tablet Place 1 tablet (0.4  mg total) under the tongue every 5 (five) minutes as needed.  25 tablet  11  . pravastatin (PRAVACHOL) 40 MG tablet Take 1 tablet (40 mg total) by mouth daily.  30 tablet  6  . triamcinolone ointment (KENALOG) 0.1 % as needed.       No current facility-administered medications on file prior to visit.    There were no vitals taken for this visit.  1. Risk factors, based on past  M,S,F history- vascular risk factors include hypertension and dyslipidemia.  Patient has known coronary artery disease, as well as a AAA  2.  Physical activities: Does remarkably well for age  39.  Depression/mood: No history depression and mood disorder  4.  Hearing: No major deficits  5.  ADL's: Requires only minimal assistance with aspects of daily living  6.  Fall risk: Moderate due to  age  5.  Home safety: No problems identified  8.  Height weight, and visual acuity; height and weight stable.  He is followed by ophthalmology.  Due to macular degeneration and she does have some visual impairment  9.  Counseling: Heart healthy diet.   Encouraged  10. Lab orders based on risk factors: Laboratory update including lipid profile reviewed  11. Referral : Not appropriate at this time  12. Care plan: Followup cardiology and [facility]  13. Cognitive assessment: Alert and oriented.  Normal affect.  No cognitive dysfunction  14. Screening: Not appropriate at this time  15. Provider List Update: Followup cardiology and [facility]      Review of Systems  Constitutional: Negative.   HENT: Negative for congestion, dental problem, hearing loss, rhinorrhea, sinus pressure, sore throat and tinnitus.   Eyes: Negative for pain, discharge and visual disturbance.  Respiratory: Negative for cough and shortness of breath.   Cardiovascular: Negative for chest pain, palpitations and leg swelling.  Gastrointestinal: Negative for nausea, vomiting, abdominal pain, diarrhea, constipation, blood in stool and abdominal distention.  Genitourinary: Negative for dysuria, urgency, frequency, hematuria, flank pain, vaginal bleeding, vaginal discharge, difficulty urinating, vaginal pain and pelvic pain.  Musculoskeletal: Negative for arthralgias, gait problem and joint swelling.  Skin: Negative for rash.  Neurological: Negative for dizziness, syncope, speech difficulty, weakness, numbness and headaches.  Hematological: Negative for adenopathy.  Psychiatric/Behavioral: Negative for behavioral problems, dysphoric mood and agitation. The patient is not nervous/anxious.        Objective:   Physical Exam  Constitutional: She is oriented to person, place, and time. She appears well-developed and well-nourished.  HENT:  Head: Normocephalic.  Right Ear: External ear normal.  Left Ear: External ear normal.  Mouth/Throat: Oropharynx is clear and moist.  Eyes: Conjunctivae and EOM are normal. Pupils are equal, round, and reactive to light.  Neck: Normal range of motion. Neck supple. No thyromegaly present.  Cardiovascular: Normal rate, regular rhythm, normal  heart sounds and intact distal pulses.   Bilateral femoral bruits left greater than the right  Pulmonary/Chest: Effort normal and breath sounds normal.  Abdominal: Soft. Bowel sounds are normal. She exhibits no mass. There is no tenderness.  Prominent aortic pulsation with bruit  Musculoskeletal: Normal range of motion.  Lymphadenopathy:    She has no cervical adenopathy.  Neurological: She is alert and oriented to person, place, and time.  Skin: Skin is warm and dry. No rash noted.  Psychiatric: She has a normal mood and affect. Her behavior is normal.          Assessment & Plan:  Preventive health examination  HTN-stable CAD- f/u cardiology PAD/AAA-followup [facility] HLD Continue statin therapy  CPX  `12 mon

## 2014-03-17 ENCOUNTER — Other Ambulatory Visit: Payer: Self-pay | Admitting: Cardiovascular Disease

## 2014-04-24 ENCOUNTER — Encounter: Payer: Self-pay | Admitting: Family

## 2014-04-25 ENCOUNTER — Ambulatory Visit (INDEPENDENT_AMBULATORY_CARE_PROVIDER_SITE_OTHER)
Admission: RE | Admit: 2014-04-25 | Discharge: 2014-04-25 | Disposition: A | Discharge status: Home / Self Care | Payer: Medicare Other | Source: Ambulatory Visit | Attending: Family | Admitting: Family

## 2014-04-25 ENCOUNTER — Ambulatory Visit (HOSPITAL_COMMUNITY)
Admission: RE | Admit: 2014-04-25 | Discharge: 2014-04-25 | Disposition: A | Discharge status: Home / Self Care | Payer: Medicare Other | Source: Ambulatory Visit | Attending: Family | Admitting: Family

## 2014-04-25 ENCOUNTER — Encounter: Payer: Self-pay | Admitting: Family

## 2014-04-25 ENCOUNTER — Ambulatory Visit (INDEPENDENT_AMBULATORY_CARE_PROVIDER_SITE_OTHER): Payer: Medicare Other | Admitting: Family

## 2014-04-25 VITALS — BP 121/64 | HR 52 | Resp 14 | Ht 64.0 in | Wt 116.0 lb

## 2014-04-25 DIAGNOSIS — I6529 Occlusion and stenosis of unspecified carotid artery: Secondary | ICD-10-CM

## 2014-04-25 DIAGNOSIS — N281 Cyst of kidney, acquired: Secondary | ICD-10-CM | POA: Insufficient documentation

## 2014-04-25 DIAGNOSIS — I714 Abdominal aortic aneurysm, without rupture, unspecified: Secondary | ICD-10-CM

## 2014-04-25 DIAGNOSIS — I2510 Atherosclerotic heart disease of native coronary artery without angina pectoris: Secondary | ICD-10-CM

## 2014-04-25 DIAGNOSIS — I6523 Occlusion and stenosis of bilateral carotid arteries: Secondary | ICD-10-CM

## 2014-04-25 NOTE — Addendum Note (Signed)
Addended by: Dorthula Rue L on: 04/25/2014 03:37 PM   Modules accepted: Orders

## 2014-04-25 NOTE — Patient Instructions (Signed)
Abdominal Aortic Aneurysm An aneurysm is a weakened or damaged part of an artery wall that bulges from the normal force of blood pumping through the body. An abdominal aortic aneurysm is an aneurysm that occurs in the lower part of the aorta, the main artery of the body.  The major concern with an abdominal aortic aneurysm is that it can enlarge and burst (rupture) or blood can flow between the layers of the wall of the aorta through a tear (aorticdissection). Both of these conditions can cause bleeding inside the body and can be life threatening unless diagnosed and treated promptly. CAUSES  The exact cause of an abdominal aortic aneurysm is unknown. Some contributing factors are:   A hardening of the arteries caused by the buildup of fat and other substances in the lining of a blood vessel (arteriosclerosis).  Inflammation of the walls of an artery (arteritis).   Connective tissue diseases, such as Marfan syndrome.   Abdominal trauma.   An infection, such as syphilis or staphylococcus, in the wall of the aorta (infectious aortitis) caused by bacteria. RISK FACTORS  Risk factors that contribute to an abdominal aortic aneurysm may include:  Age older than 60 years.   High blood pressure (hypertension).  Female gender.  Ethnicity (white race).  Obesity.  Family history of aneurysm (first degree relatives only).  Tobacco use. PREVENTION  The following healthy lifestyle habits may help decrease your risk of abdominal aortic aneurysm:  Quitting smoking. Smoking can raise your blood pressure and cause arteriosclerosis.  Limiting or avoiding alcohol.  Keeping your blood pressure, blood sugar level, and cholesterol levels within normal limits.  Decreasing your salt intake. In somepeople, too much salt can raise blood pressure and increase your risk of abdominal aortic aneurysm.  Eating a diet low in saturated fats and cholesterol.  Increasing your fiber intake by including  whole grains, vegetables, and fruits in your diet. Eating these foods may help lower blood pressure.  Maintaining a healthy weight.  Staying physically active and exercising regularly. SYMPTOMS  The symptoms of abdominal aortic aneurysm may vary depending on the size and rate of growth of the aneurysm.Most grow slowly and do not have any symptoms. When symptoms do occur, they may include:  Pain (abdomen, side, lower back, or groin). The pain may vary in intensity. A sudden onset of severe pain may indicate that the aneurysm has ruptured.  Feeling full after eating only small amounts of food.  Nausea or vomiting or both.  Feeling a pulsating lump in the abdomen.  Feeling faint or passing out. DIAGNOSIS  Since most unruptured abdominal aortic aneurysms have no symptoms, they are often discovered during diagnostic exams for other conditions. An aneurysm may be found during the following procedures:  Ultrasonography (A one-time screening for abdominal aortic aneurysm by ultrasonography is also recommended for all men aged 65-75 years who have ever smoked).  X-ray exams.  A computed tomography (CT).  Magnetic resonance imaging (MRI).  Angiography or arteriography. TREATMENT  Treatment of an abdominal aortic aneurysm depends on the size of your aneurysm, your age, and risk factors for rupture. Medication to control blood pressure and pain may be used to manage aneurysms smaller than 6 cm. Regular monitoring for enlargement may be recommended by your caregiver if:  The aneurysm is 3-4 cm in size (an annual ultrasonography may be recommended).  The aneurysm is 4-4.5 cm in size (an ultrasonography every 6 months may be recommended).  The aneurysm is larger than 4.5 cm in   size (your caregiver may ask that you be examined by a vascular surgeon). If your aneurysm is larger than 6 cm, surgical repair may be recommended. There are two main methods for repair of an aneurysm:   Endovascular  repair (a minimally invasive surgery). This is done most often.  Open repair. This method is used if an endovascular repair is not possible. Document Released: 04/20/2005 Document Revised: 11/05/2012 Document Reviewed: 08/10/2012 ExitCare Patient Information 2015 [employer]. This information is not intended to replace advice given to you by your health care provider. Make sure you discuss any questions you have with your health care provider.   Stroke Prevention Some medical conditions and behaviors are associated with an increased chance of having a stroke. You may prevent a stroke by making healthy choices and managing medical conditions. HOW CAN I REDUCE MY RISK OF HAVING A STROKE?   Stay physically active. Get at least 30 minutes of activity on most or all days.  Do not smoke. It may also be helpful to avoid exposure to secondhand smoke.  Limit alcohol use. Moderate alcohol use is considered to be:  No more than 2 drinks per day for men.  No more than 1 drink per day for nonpregnant women.  Eat healthy foods. This involves:  Eating 5 or more servings of fruits and vegetables a day.  Making dietary changes that address high blood pressure (hypertension), high cholesterol, diabetes, or obesity.  Manage your cholesterol levels.  Making food choices that are high in fiber and low in saturated fat, trans fat, and cholesterol may control cholesterol levels.  Take any prescribed medicines to control cholesterol as directed by your health care provider.  Manage your diabetes.  Controlling your carbohydrate and sugar intake is recommended to manage diabetes.  Take any prescribed medicines to control diabetes as directed by your health care provider.  Control your hypertension.  Making food choices that are low in salt (sodium), saturated fat, trans fat, and cholesterol is recommended to manage hypertension.  Take any prescribed medicines to control hypertension as directed  by your health care provider.  Maintain a healthy weight.  Reducing calorie intake and making food choices that are low in sodium, saturated fat, trans fat, and cholesterol are recommended to manage weight.  Stop drug abuse.  Avoid taking birth control pills.  Talk to your health care provider about the risks of taking birth control pills if you are over 35 years old, smoke, get migraines, or have ever had a blood clot.  Get evaluated for sleep disorders (sleep apnea).  Talk to your health care provider about getting a sleep evaluation if you snore a lot or have excessive sleepiness.  Take medicines only as directed by your health care provider.  For some people, aspirin or blood thinners (anticoagulants) are helpful in reducing the risk of forming abnormal blood clots that can lead to stroke. If you have the irregular heart rhythm of atrial fibrillation, you should be on a blood thinner unless there is a good reason you cannot take them.  Understand all your medicine instructions.  Make sure that other conditions (such as anemia or atherosclerosis) are addressed. SEEK IMMEDIATE MEDICAL CARE IF:   You have sudden weakness or numbness of the face, arm, or leg, especially on one side of the body.  Your face or eyelid droops to one side.  You have sudden confusion.  You have trouble speaking (aphasia) or understanding.  You have sudden trouble seeing in one or   both eyes.  You have sudden trouble walking.  You have dizziness.  You have a loss of balance or coordination.  You have a sudden, severe headache with no known cause.  You have new chest pain or an irregular heartbeat. Any of these symptoms may represent a serious problem that is an emergency. Do not wait to see if the symptoms will go away. Get medical help at once. Call your local emergency services (911 in [place] Do not drive yourself to the hospital. Document Released: 08/18/2004 Document Revised: 11/25/2013  Document Reviewed: 01/11/2013 ExitCare Patient Information 2015 [employer]. This information is not intended to replace advice given to you by your health care provider. Make sure you discuss any questions you have with your health care provider.  

## 2014-04-25 NOTE — Progress Notes (Signed)
[facility] HISTORY AND PHYSICAL   MRN : [id number]  History of Present Illness:   [name] is a 80 y.o. female patient of Dr. Bridgett Larsson followed for known AAA, who presents with chief complaint: follow up for AAA. Previous studies demonstrate an AAA, measuring 4.63 cm. The patient does not have back or abdominal pain. The patient is not a smoker.  The patient denies claudication in legs with walking.  The patient reports history of TIA symptoms in 2013 and 2014, both manifested as transient expressive aphasia, no hemiparesis, no monocular blindness. No carotid Duplex result on file, Dr. Acie Fredrickson is her cardiologist, has a cardiac stent, denies any stroke or TIA activity since then.  Plavix prescribed by Dr. Acie Fredrickson, since her TIA, per daughter.  Pt Diabetic: No  Pt smoker: former smoker, quit in 2012, daughter states she smokes around her mother but states she will no longer smoke around her mother.  Pt meds include: Statin :Yes Betablocker: Yes ASA: No Other anticoagulants/antiplatelets: Plavix  Current Outpatient Prescriptions  Medication Sig Dispense Refill  . amLODipine (NORVASC) 10 MG tablet TAKE ONE TABLET BY MOUTH ONCE DAILY  30 tablet  6  . clopidogrel (PLAVIX) 75 MG tablet TAKE ONE TABLET BY MOUTH ONCE DAILY  30 tablet  6  . hydrOXYzine (ATARAX/VISTARIL) 25 MG tablet Take 25 mg by mouth as needed.       . IBUPROFEN PO Take by mouth as needed.        . metoprolol tartrate (LOPRESSOR) 25 MG tablet TAKE ONE TABLET BY MOUTH TWICE DAILY  180 tablet  0  . Multiple Vitamins-Minerals (PRESERVISION AREDS 2 PO) Take by mouth 2 (two) times daily.      . nitroGLYCERIN (NITROSTAT) 0.4 MG SL tablet Place 1 tablet (0.4 mg total) under the tongue every 5 (five) minutes as needed.  25 tablet  11  . pravastatin (PRAVACHOL) 40 MG tablet Take 1 tablet (40 mg total) by mouth daily.  30 tablet  6  . triamcinolone ointment (KENALOG) 0.1 % as needed.      . clobetasol  cream (TEMOVATE) 6.65 % Apply 1 application topically as needed.        No current facility-administered medications for this visit.    Past Medical History  Diagnosis Date  . DJD (degenerative joint disease)   . CAD (coronary artery disease)   . Hypertension   . Hyperlipidemia   . Abdominal aortic aneurysm   . Osteopenia   . Aortic insufficiency   . Broken hip     left  . Stroke 2013  . Carotid artery occlusion     Social History History  Substance Use Topics  . Smoking status: Former Smoker -- 50 years    Types: Cigarettes    Quit date: 01/09/2011  . Smokeless tobacco: Never Used  . Alcohol Use: No    Family History Family History  Problem Relation Age of Onset  . Coronary artery disease Mother   . Heart disease Mother   . Dementia Sister   . Heart disease Sister   . Coronary artery disease Brother   . Cancer Sister     Cervical or Ovarian    Surgical History Past Surgical History  Procedure Laterality Date  . Cataract extraction    . Abdominal hysterectomy    . Cholecystectomy    . Tonsillectomy    . Ptca    . Appendectomy    . Cardiac catheterization  2000  PTCA,and stenting to RCA  . Hip fracture surgery      Left hip ORIF  . Fracture surgery Left   . Eye surgery      Allergies  Allergen Reactions  . Alendronate Sodium   . Chlordiazepoxide   . Librium   . Zocor [Simvastatin]     Leg cramps    Current Outpatient Prescriptions  Medication Sig Dispense Refill  . amLODipine (NORVASC) 10 MG tablet TAKE ONE TABLET BY MOUTH ONCE DAILY  30 tablet  6  . clopidogrel (PLAVIX) 75 MG tablet TAKE ONE TABLET BY MOUTH ONCE DAILY  30 tablet  6  . hydrOXYzine (ATARAX/VISTARIL) 25 MG tablet Take 25 mg by mouth as needed.       . IBUPROFEN PO Take by mouth as needed.        . metoprolol tartrate (LOPRESSOR) 25 MG tablet TAKE ONE TABLET BY MOUTH TWICE DAILY  180 tablet  0  . Multiple Vitamins-Minerals (PRESERVISION AREDS 2 PO) Take by mouth 2 (two) times  daily.      . nitroGLYCERIN (NITROSTAT) 0.4 MG SL tablet Place 1 tablet (0.4 mg total) under the tongue every 5 (five) minutes as needed.  25 tablet  11  . pravastatin (PRAVACHOL) 40 MG tablet Take 1 tablet (40 mg total) by mouth daily.  30 tablet  6  . triamcinolone ointment (KENALOG) 0.1 % as needed.      . clobetasol cream (TEMOVATE) 3.00 % Apply 1 application topically as needed.        No current facility-administered medications for this visit.     REVIEW OF SYSTEMS: See HPI for pertinent positives and negatives.  Physical Examination Filed Vitals:   04/25/14 0944  BP: 121/64  Pulse: 52  Resp: 14  Height: 5\' 4"  (1.626 m)  Weight: 116 lb (52.617 kg)  SpO2: 98%   Body mass index is 19.9 kg/(m^2).  General: A&O x 3, WD, .  Pulmonary: Sym exp, good air movt, CTAB, no rales, rhonchi, or wheezing.  Cardiac: RRR, Nl S1, S2, no detected murmur.  Carotid Bruits  Left  Right    Negative  Negative   Aorta is palpable  Radial pulses are 2+ palpable and =  VASCULAR EXAM:  LE Pulses  LEFT  RIGHT   POPLITEAL  not palpable  not palpable   POSTERIOR TIBIAL  palpable  palpable   DORSALIS PEDIS  ANTERIOR TIBIAL  palpable  palpable   Gastrointestinal: soft, NTND, -G/R, - HSM, - masses, - CVAT B.  Musculoskeletal: M/S 4/5 throughout, Extremities without ischemic changes.  Neurologic: CN 2-12 intact except has some hearing loss, Pain and light touch intact in extremities are intact except, Motor exam as listed above.    Non-Invasive Vascular Imaging (04/25/2014):  ABDOMINAL AORTA DUPLEX EVALUATION    INDICATION: Follow up aortic aneurysm    PREVIOUS INTERVENTION(S): None    DUPLEX EXAM:     LOCATION DIAMETER AP (cm) DIAMETER TRANSVERSE (cm) VELOCITIES (cm/sec)  Aorta Proximal 2.1 1.9 60  Aorta Mid 3.2 3.2 52  Aorta Distal 4.8 4.9 24  Right Common Iliac Artery N/V - -  Left Common Iliac Artery N/V - -    Previous max aortic diameter:  4.7 x 4.7 Date: 10/18/2013      ADDITIONAL FINDINGS: Plaque noted within aneurysm with a residual lumen measuring 1.5 cms.  A 2.5 x 2.4 cm cyst noted on the right kidney.    IMPRESSION: 1. 4.8 x 4.9 cm distal aortic aneurysm.  Compared to the previous exam:  Minimal increase in size   CEREBROVASCULAR DUPLEX EVALUATION    INDICATION: Carotid artery disease    PREVIOUS INTERVENTION(S): None    DUPLEX EXAM: Carotid duplex    RIGHT  LEFT  Peak Systolic Velocities (cm/s) End Diastolic Velocities (cm/s) Plaque LOCATION Peak Systolic Velocities (cm/s) End Diastolic Velocities (cm/s) Plaque  65 7 HT CCA PROXIMAL 79 10 HM  54 8 HT CCA MID 41 6 HM  49 7 - CCA DISTAL 42 7 HT  61 0 HT ECA 77 0 HT  54 13 CP ICA PROXIMAL 67 13 HT  66 17 - ICA MID 56 10 -  68 13 - ICA DISTAL 66 10 -    1.25 ICA / CCA Ratio (PSV) 1.6  Antegrade Vertebral Flow Antegrade  175 Brachial Systolic Pressure (mmHg) 102  Triphasic Brachial Artery Waveforms Triphasic    Plaque Morphology:  HM = Homogeneous, HT = Heterogeneous, CP = Calcific Plaque, SP = Smooth Plaque, IP = Irregular Plaque     ADDITIONAL FINDINGS:     IMPRESSION: 1. Less than 40% bilateral internal carotid artery stenosis.    Compared to the previous exam:  No significant change      ASSESSMENT:  [name] is a 80 y.o. female who presents with asymptomatic AAA with slight increase in size. Carotid Duplex shows minimal bilateral ICA stenosis, she has no recent TIA or stroke activity.    PLAN:   Based on today's exam and non-invasive vascular lab results, the patient will follow up in 6 months with the following tests AAA Duplex, carotid Duplex in a year. I discussed in depth with the patient the nature of atherosclerosis, and emphasized the importance of maximal medical management including strict control of blood pressure, blood glucose, and lipid levels, obtaining regular exercise, and cessation of smoking.  The patient is aware that without maximal medical  management the underlying atherosclerotic disease process will progress, limiting the benefit of any interventions. Consideration for repair of AAA would be made when the size is 5.5 cm, growth > 1 cm/yr, and symptomatic status. The patient was given information about stroke prevention and what symptoms should prompt the patient to seek immediate medical care. The patient was given information about AAA including signs, symptoms, treatment,  what symptoms should prompt the patient to seek immediate medical care, and how to minimize the risk of enlargement and rupture of aneurysms.  Thank you for allowing [place] to participate in this patient's care.  Clemon Chambers, RN, MSN, FNP-C [facility] Office: [phone]  Clinic MD: Bridgett Larsson 04/25/2014 9:53 AM

## 2014-07-22 DIAGNOSIS — Z23 Encounter for immunization: Secondary | ICD-10-CM | POA: Diagnosis not present

## 2014-08-11 ENCOUNTER — Emergency Department (HOSPITAL_COMMUNITY): Payer: Medicare Other

## 2014-08-11 ENCOUNTER — Observation Stay (HOSPITAL_COMMUNITY): Payer: Medicare Other

## 2014-08-11 ENCOUNTER — Encounter (HOSPITAL_COMMUNITY): Payer: Self-pay | Admitting: *Deleted

## 2014-08-11 ENCOUNTER — Inpatient Hospital Stay (HOSPITAL_COMMUNITY)
Admission: EM | Admit: 2014-08-11 | Discharge: 2014-08-13 | DRG: 065 | Disposition: A | Discharge status: Home Health | Payer: Medicare Other | Attending: Internal Medicine | Admitting: Internal Medicine

## 2014-08-11 DIAGNOSIS — F0390 Unspecified dementia without behavioral disturbance: Secondary | ICD-10-CM | POA: Diagnosis present

## 2014-08-11 DIAGNOSIS — I739 Peripheral vascular disease, unspecified: Secondary | ICD-10-CM | POA: Diagnosis present

## 2014-08-11 DIAGNOSIS — Z955 Presence of coronary angioplasty implant and graft: Secondary | ICD-10-CM | POA: Diagnosis not present

## 2014-08-11 DIAGNOSIS — I714 Abdominal aortic aneurysm, without rupture: Secondary | ICD-10-CM | POA: Diagnosis present

## 2014-08-11 DIAGNOSIS — R471 Dysarthria and anarthria: Secondary | ICD-10-CM | POA: Diagnosis present

## 2014-08-11 DIAGNOSIS — I351 Nonrheumatic aortic (valve) insufficiency: Secondary | ICD-10-CM | POA: Diagnosis present

## 2014-08-11 DIAGNOSIS — I2510 Atherosclerotic heart disease of native coronary artery without angina pectoris: Secondary | ICD-10-CM | POA: Diagnosis present

## 2014-08-11 DIAGNOSIS — E785 Hyperlipidemia, unspecified: Secondary | ICD-10-CM | POA: Diagnosis present

## 2014-08-11 DIAGNOSIS — G9340 Encephalopathy, unspecified: Secondary | ICD-10-CM | POA: Diagnosis present

## 2014-08-11 DIAGNOSIS — R4182 Altered mental status, unspecified: Secondary | ICD-10-CM | POA: Diagnosis not present

## 2014-08-11 DIAGNOSIS — Z7901 Long term (current) use of anticoagulants: Secondary | ICD-10-CM

## 2014-08-11 DIAGNOSIS — I63311 Cerebral infarction due to thrombosis of right middle cerebral artery: Principal | ICD-10-CM | POA: Insufficient documentation

## 2014-08-11 DIAGNOSIS — N179 Acute kidney failure, unspecified: Secondary | ICD-10-CM | POA: Diagnosis present

## 2014-08-11 DIAGNOSIS — G459 Transient cerebral ischemic attack, unspecified: Secondary | ICD-10-CM | POA: Diagnosis not present

## 2014-08-11 DIAGNOSIS — I359 Nonrheumatic aortic valve disorder, unspecified: Secondary | ICD-10-CM | POA: Diagnosis not present

## 2014-08-11 DIAGNOSIS — Z8249 Family history of ischemic heart disease and other diseases of the circulatory system: Secondary | ICD-10-CM

## 2014-08-11 DIAGNOSIS — I639 Cerebral infarction, unspecified: Secondary | ICD-10-CM | POA: Diagnosis not present

## 2014-08-11 DIAGNOSIS — Z87891 Personal history of nicotine dependence: Secondary | ICD-10-CM | POA: Diagnosis not present

## 2014-08-11 DIAGNOSIS — I10 Essential (primary) hypertension: Secondary | ICD-10-CM | POA: Diagnosis present

## 2014-08-11 DIAGNOSIS — Z8673 Personal history of transient ischemic attack (TIA), and cerebral infarction without residual deficits: Secondary | ICD-10-CM

## 2014-08-11 DIAGNOSIS — R531 Weakness: Secondary | ICD-10-CM | POA: Diagnosis not present

## 2014-08-11 DIAGNOSIS — R410 Disorientation, unspecified: Secondary | ICD-10-CM | POA: Diagnosis not present

## 2014-08-11 LAB — URINALYSIS, ROUTINE W REFLEX MICROSCOPIC
Bilirubin Urine: NEGATIVE
GLUCOSE, UA: NEGATIVE mg/dL
Hgb urine dipstick: NEGATIVE
KETONES UR: NEGATIVE mg/dL
LEUKOCYTES UA: NEGATIVE
NITRITE: NEGATIVE
PH: 7.5 (ref 5.0–8.0)
PROTEIN: NEGATIVE mg/dL
Specific Gravity, Urine: 1.017 (ref 1.005–1.030)
Urobilinogen, UA: 1 mg/dL (ref 0.0–1.0)

## 2014-08-11 LAB — COMPREHENSIVE METABOLIC PANEL
ALT: 10 U/L (ref 0–35)
AST: 21 U/L (ref 0–37)
Albumin: 3.8 g/dL (ref 3.5–5.2)
Alkaline Phosphatase: 74 U/L (ref 39–117)
Anion gap: 9 (ref 5–15)
BUN: 18 mg/dL (ref 6–23)
CALCIUM: 8.9 mg/dL (ref 8.4–10.5)
CO2: 23 mmol/L (ref 19–32)
Chloride: 104 mEq/L (ref 96–112)
Creatinine, Ser: 1.71 mg/dL — ABNORMAL HIGH (ref 0.50–1.10)
GFR calc Af Amer: 30 mL/min — ABNORMAL LOW (ref >90)
GFR calc non Af Amer: 26 mL/min — ABNORMAL LOW (ref >90)
GLUCOSE: 154 mg/dL — AB (ref 70–99)
POTASSIUM: 4.5 mmol/L (ref 3.5–5.1)
SODIUM: 136 mmol/L (ref 135–145)
Total Bilirubin: 0.5 mg/dL (ref 0.3–1.2)
Total Protein: 7.1 g/dL (ref 6.0–8.3)

## 2014-08-11 LAB — PROTIME-INR
INR: 1 (ref 0.00–1.49)
Prothrombin Time: 13.3 seconds (ref 11.6–15.2)

## 2014-08-11 LAB — CBC
HCT: 37 % (ref 36.0–46.0)
Hemoglobin: 12.5 g/dL (ref 12.0–15.0)
MCH: 29.9 pg (ref 26.0–34.0)
MCHC: 33.8 g/dL (ref 30.0–36.0)
MCV: 88.5 fL (ref 78.0–100.0)
Platelets: 213 10*3/uL (ref 150–400)
RBC: 4.18 MIL/uL (ref 3.87–5.11)
RDW: 12.9 % (ref 11.5–15.5)
WBC: 5.5 10*3/uL (ref 4.0–10.5)

## 2014-08-11 LAB — CBG MONITORING, ED: GLUCOSE-CAPILLARY: 154 mg/dL — AB (ref 70–99)

## 2014-08-11 MED ORDER — ENOXAPARIN SODIUM 30 MG/0.3ML ~~LOC~~ SOLN
30.0000 mg | SUBCUTANEOUS | Status: DC
  Administered 2014-08-12 – 2014-08-13 (×2): 30 mg via SUBCUTANEOUS
  Filled 2014-08-11 (×2): qty 0.3

## 2014-08-11 MED ORDER — SODIUM CHLORIDE 0.9 % IV SOLN
INTRAVENOUS | Status: DC
  Administered 2014-08-11 – 2014-08-13 (×4): via INTRAVENOUS

## 2014-08-11 MED ORDER — SENNOSIDES-DOCUSATE SODIUM 8.6-50 MG PO TABS: 1.0000 | ORAL_TABLET | Freq: Every evening | ORAL | Status: DC | PRN

## 2014-08-11 MED ORDER — OCUVITE-LUTEIN PO CAPS
ORAL_CAPSULE | Freq: Two times a day (BID) | ORAL | Status: DC
  Filled 2014-08-11: qty 1

## 2014-08-11 MED ORDER — PRAVASTATIN SODIUM 40 MG PO TABS
40.0000 mg | ORAL_TABLET | Freq: Every day | ORAL | Status: DC
  Administered 2014-08-12 – 2014-08-13 (×2): 40 mg via ORAL
  Filled 2014-08-11 (×2): qty 1

## 2014-08-11 MED ORDER — CLOPIDOGREL BISULFATE 75 MG PO TABS
75.0000 mg | ORAL_TABLET | Freq: Every day | ORAL | Status: DC
  Administered 2014-08-11 – 2014-08-13 (×3): 75 mg via ORAL
  Filled 2014-08-11 (×3): qty 1

## 2014-08-11 MED ORDER — NITROGLYCERIN 0.4 MG SL SUBL: 0.4000 mg | SUBLINGUAL_TABLET | SUBLINGUAL | Status: DC | PRN

## 2014-08-11 MED ORDER — ADULT MULTIVITAMIN W/MINERALS CH
1.0000 | ORAL_TABLET | Freq: Two times a day (BID) | ORAL | Status: DC
  Administered 2014-08-12 – 2014-08-13 (×3): 1 via ORAL
  Filled 2014-08-11 (×3): qty 1

## 2014-08-11 MED ORDER — AMLODIPINE BESYLATE 10 MG PO TABS
10.0000 mg | ORAL_TABLET | Freq: Every day | ORAL | Status: DC
  Administered 2014-08-12: 10 mg via ORAL
  Filled 2014-08-11: qty 1

## 2014-08-11 MED ORDER — METOPROLOL TARTRATE 25 MG PO TABS
25.0000 mg | ORAL_TABLET | Freq: Two times a day (BID) | ORAL | Status: DC
  Administered 2014-08-11 – 2014-08-13 (×4): 25 mg via ORAL
  Filled 2014-08-11 (×4): qty 1

## 2014-08-11 MED ORDER — STROKE: EARLY STAGES OF RECOVERY BOOK
Freq: Once | Status: AC
  Administered 2014-08-11: 23:00:00
  Filled 2014-08-11: qty 1

## 2014-08-11 NOTE — Progress Notes (Signed)
Pt transported to unit from ED via NT x 2. Pt alert upon arrival. No complaints of pain or discomfort. No signs or symptoms of acute distress. Pt oriented to unit as well as unit procedures. Pt is connected to telemetry and central monitoring notified. Pt resting in bed at lowest position, bed alarm, call light in reach. Will continue to monitor. Fortino Sic, RN, BSN 08/11/2014 11:09 PM

## 2014-08-11 NOTE — ED Notes (Signed)
Pts daughter states that she has had been altered. Pt was sitting watching tv with daughter when she began having inappropriate speech. Pt does not know her name at this time. Pt able to follow commands at triage. Pt denies any pain. Family states that she is generally weak but no one sided weakness. MD at bedside to evaluate

## 2014-08-11 NOTE — ED Notes (Signed)
Patient transported to CT 

## 2014-08-11 NOTE — ED Notes (Signed)
Rich, RN on 4N advised that patient passed stroke swallow screen.

## 2014-08-11 NOTE — ED Notes (Signed)
MD at bedside. 

## 2014-08-11 NOTE — ED Notes (Signed)
Pt returned to room (from XR). 

## 2014-08-11 NOTE — ED Notes (Signed)
Pt to be transported to floor after return from MRI.

## 2014-08-11 NOTE — ED Notes (Signed)
Patient transported to MRI 

## 2014-08-11 NOTE — ED Provider Notes (Signed)
CSN: [id number]     Arrival date & time 08/11/14  1819 History   First MD Initiated Contact with Patient 08/11/14 1826     Chief Complaint  Patient presents with  . Altered Mental Status     (Consider location/radiation/quality/duration/timing/severity/associated sxs/prior Treatment) HPI 81 year old female past medical history as below notable for unknown 4 x 4 cm AAA, CAD, history of stroke who presents to ED with daughter states patient is confused. Confusion started approximately 30 minutes ago. Daughter states at baseline patient is able to communicate well and is her name and where she is. Daughter states half hour ago she started muttering words that did not make sense. Prior to that she was in her usual state of health had no recent illness. Daughter states patient normally can ambulate with use of a cane and is now very weak. On questioning daughter about patient's known AAA she states have been watching it. Daughter also states patient is a full code. Past Medical History  Diagnosis Date  . DJD (degenerative joint disease)   . CAD (coronary artery disease)   . Hypertension   . Hyperlipidemia   . Abdominal aortic aneurysm   . Osteopenia   . Aortic insufficiency   . Broken hip     left  . Stroke 2013  . Carotid artery occlusion    Past Surgical History  Procedure Laterality Date  . Cataract extraction    . Abdominal hysterectomy    . Cholecystectomy    . Tonsillectomy    . Ptca    . Appendectomy    . Cardiac catheterization  2000    PTCA,and stenting to RCA  . Hip fracture surgery      Left hip ORIF  . Fracture surgery Left   . Eye surgery     Family History  Problem Relation Age of Onset  . Coronary artery disease Mother   . Heart disease Mother   . Dementia Sister   . Heart disease Sister   . Coronary artery disease Brother   . Cancer Sister     Cervical or Ovarian   History  Substance Use Topics  . Smoking status: Former Smoker -- 50 years    Types:  Cigarettes    Quit date: 01/09/2011  . Smokeless tobacco: Never Used  . Alcohol Use: No   OB History    No data available     Review of Systems Unable to obtain   Allergies  Alendronate sodium; Chlordiazepoxide; Librium; and Zocor  Home Medications   Prior to Admission medications   Medication Sig Start Date End Date Taking? Authorizing Provider  amLODipine (NORVASC) 10 MG tablet TAKE ONE TABLET BY MOUTH ONCE DAILY    Thayer Headings, MD  clobetasol cream (TEMOVATE) 0.07 % Apply 1 application topically as needed.  11/29/13   Historical Provider, MD  clopidogrel (PLAVIX) 75 MG tablet TAKE ONE TABLET BY MOUTH ONCE DAILY    Thayer Headings, MD  hydrOXYzine (ATARAX/VISTARIL) 25 MG tablet Take 25 mg by mouth as needed.  02/11/12   Historical Provider, MD  IBUPROFEN PO Take by mouth as needed.      Historical Provider, MD  metoprolol tartrate (LOPRESSOR) 25 MG tablet TAKE ONE TABLET BY MOUTH TWICE DAILY 03/18/14   Thayer Headings, MD  Multiple Vitamins-Minerals (PRESERVISION AREDS 2 PO) Take by mouth 2 (two) times daily.    Historical Provider, MD  nitroGLYCERIN (NITROSTAT) 0.4 MG SL tablet Place 1 tablet (0.4 mg total) under the  tongue every 5 (five) minutes as needed. 09/15/11   Thayer Headings, MD  pravastatin (PRAVACHOL) 40 MG tablet Take 1 tablet (40 mg total) by mouth daily.    Thayer Headings, MD  triamcinolone ointment (KENALOG) 0.1 % as needed. 08/30/13   Historical Provider, MD   BP 160/67 mmHg  Pulse 71  Resp 21  SpO2 97% Physical Exam  Constitutional: She appears well-developed and well-nourished. No distress.  HENT:  Head: Normocephalic and atraumatic.  Eyes: Conjunctivae are normal.  Neck: Normal range of motion.  Cardiovascular: Normal rate, regular rhythm, normal heart sounds and intact distal pulses.   No murmur heard. Pulmonary/Chest: Effort normal and breath sounds normal. No respiratory distress. She has no wheezes. She has no rales. She exhibits no tenderness.   Abdominal: Soft. Bowel sounds are normal. She exhibits pulsatile midline mass. She exhibits no distension. There is no tenderness.  Musculoskeletal: Normal range of motion.  Neurological: She is alert. No cranial nerve deficit.  HDS, AAOx0. PERRL, EOMI, TML, face sym. CN 2-12 grossly intact. 5/5 sym, no drift, SILT, normal coordination.    Skin: Skin is warm and dry.  Psychiatric: She has a normal mood and affect.  Nursing note and vitals reviewed.   ED Course  Procedures (including critical care time) Labs Review Labs Reviewed  CBG MONITORING, ED - Abnormal; Notable for the following:    Glucose-Capillary 154 (*)    All other components within normal limits  CULTURE, BLOOD (ROUTINE X 2)  CULTURE, BLOOD (ROUTINE X 2)  CBC  COMPREHENSIVE METABOLIC PANEL  URINALYSIS, ROUTINE W REFLEX MICROSCOPIC  PROTIME-INR    Imaging Review No results found.   EKG Interpretation   Date/Time:  Monday August 11 2014 18:32:15 EST Ventricular Rate:  72 PR Interval:  187 QRS Duration: 79 QT Interval:  388 QTC Calculation: 425 R Axis:   56 Text Interpretation:  Sinus rhythm Sinus rhythm Early repolarization  pattern Abnormal ekg Confirmed by Carmin Muskrat  MD [phone]) on 08/11/2014  6:41:16 PM      MDM   Final diagnoses:  Altered mental status  81 year old female with a H&P as above who presents for altered mental status which began 30 minutes prior to arrival. Patient HDS and in no apparent distress on arrival. Relatively benign neuro exam with the exception of disorientation. Patient will receive a sepsis workup as well as CT of her head for further evaluation. Laboratory was notable for a creatinine of 1.7 and otherwise was unremarkable. Her CT of her head was also unremarkable. During reevaluation of patient, she is now able to tell me her name and where she is. Unfortunately daughter has left and went home so I'm unable to differentiate whether the pt is at baseline or not. Given  patient likely had TIA she will be admitted to hospital for further evaluation. Neurology was contacted for recommendations and they recommend MRI. Patient recently had unremarkable carotid Dopplers performed 2 months ago. Patient will be admitted to hospitalist.   Clinical Impression: 1. Altered mental status   2. TIA (transient ischemic attack)   3. Encephalopathy    Pt seen in conjunction with Dr. Kasandra Knudsen, [facility] Resident - PGY-2    Kirstie Peri, MD 08/11/14 [place], MD 08/11/14 2329

## 2014-08-11 NOTE — ED Notes (Signed)
pts daughter states that pt had eaten dinner, was sitting beside her then began to talk in "non words" and was not acting herself. Pt alert, answers "I don't know" when asked where she is, and who she is. Able to follow commands, grip strength equal bilaterally, no facial droop noted. Pt denies pain.

## 2014-08-11 NOTE — ED Notes (Signed)
Patient transported to X-ray 

## 2014-08-11 NOTE — H&P (Signed)
Hospitalist Admission History and Physical  Patient name: [name] Medical record number: [id number] Date of birth: 05/02/1924 Age: 81 y.o. Gender: female  Primary Care Provider: Nyoka Cowden, MD  Chief Complaint: encephalopathy, ? TIA/CVA  History of Present Illness:This is a 81 y.o. year old female with significant past medical history of TIA, dementia, AAA, HTN, CAD, aortic insufficiency  presenting with encephalopathy, ? TIA/CVA. Level V caveat as pt is encephalopathic. Primary history from EDP and nursing notes. Per report, pt was in otherwise normal state of health when she was noted to be confused/dysarthric and weak by pt's daughter. By the time pt presented to ER, family reported pt to be back to baseline. Pt currently denies any CP, SOB, nausea, vomiting, diarrhea, fever.  Hemodynamically stable afebrile. CBC and CMET WNL apart from Cr 1.7 and Glu 154. Head CT, CXR, UA all WNL.Marland Kitchen    Assessment and Plan: [name] is a 81 y.o. year old female presenting with encephalopathy, AKI   Active Problems:   Encephalopathy   1- Encephalopathy  -broad ddx and likely multifactorial in setting of hx/o TIA and dementia  -proceed down TIA pathway including MRI/MRA, 2D ECHO, carotid dopplers, risk stratification labs -ammonia level  -ED resident discussed case with neurology -f/u neuro recs in am  -mildly dry on exam-gently hydrate   2-AKI  -mildly dry on exam  -likely prerenal  -gently hydrate -follow  3-CAD/HTN -no active CP  -BP stable  -tele bed -cont home regimen   4- AAA -4.9x4.8 cm distal aortic aneurysm on u/s 04/2014  -no pain  -follow    FEN/GI: heart healthy diet  Prophylaxis: sub q heparin  Disposition: pending further evaluation  Code Status:Full Code    Patient Active Problem List   Diagnosis Date Noted  . Encephalopathy 08/11/2014  . Carotid stenosis 04/25/2014  . Abdominal aneurysm without mention of rupture 09/23/2011  . AAA  (abdominal aortic aneurysm) without rupture 06/24/2011  . TIA (transient ischemic attack) 05/13/2011  . AAA (abdominal aortic aneurysm) 05/13/2011  . Aortic insufficiency   . OSTEOPENIA 01/15/2009  . WEIGHT LOSS 01/15/2009  . HYPERLIPIDEMIA 05/01/2007  . HYPERTENSION 05/01/2007  . CORONARY ARTERY DISEASE 05/01/2007   Past Medical History: Past Medical History  Diagnosis Date  . DJD (degenerative joint disease)   . CAD (coronary artery disease)   . Hypertension   . Hyperlipidemia   . Abdominal aortic aneurysm   . Osteopenia   . Aortic insufficiency   . Broken hip     left  . Stroke 2013  . Carotid artery occlusion     Past Surgical History: Past Surgical History  Procedure Laterality Date  . Cataract extraction    . Abdominal hysterectomy    . Cholecystectomy    . Tonsillectomy    . Ptca    . Appendectomy    . Cardiac catheterization  2000    PTCA,and stenting to RCA  . Hip fracture surgery      Left hip ORIF  . Fracture surgery Left   . Eye surgery      Social History: History   Social History  . Marital Status: Widowed    Spouse Name: N/A    Number of Children: N/A  . Years of Education: N/A   Social History Main Topics  . Smoking status: Former Smoker -- 50 years    Types: Cigarettes    Quit date: 01/09/2011  . Smokeless tobacco: Never Used  . Alcohol Use: No  .  Drug Use: No  . Sexual Activity: None   Other Topics Concern  . None   Social History Narrative    Family History: Family History  Problem Relation Age of Onset  . Coronary artery disease Mother   . Heart disease Mother   . Dementia Sister   . Heart disease Sister   . Coronary artery disease Brother   . Cancer Sister     Cervical or Ovarian    Allergies: Allergies  Allergen Reactions  . Alendronate Sodium   . Chlordiazepoxide   . Librium   . Zocor [Simvastatin]     Leg cramps    Current Facility-Administered Medications  Medication Dose Route Frequency Provider Last  Rate Last Dose  .  stroke: mapping our early stages of recovery book   Does not apply Once Shanda Howells, MD      . amLODipine (NORVASC) tablet 10 mg  10 mg Oral Daily Shanda Howells, MD      . clopidogrel (PLAVIX) tablet 75 mg  75 mg Oral Daily Shanda Howells, MD      . enoxaparin (LOVENOX) injection 30 mg  30 mg Subcutaneous Q24H Shanda Howells, MD      . metoprolol tartrate (LOPRESSOR) tablet 25 mg  25 mg Oral BID Shanda Howells, MD      . nitroGLYCERIN (NITROSTAT) SL tablet 0.4 mg  0.4 mg Sublingual Q5 min PRN Shanda Howells, MD      . pravastatin (PRAVACHOL) tablet 40 mg  40 mg Oral Daily Shanda Howells, MD      . PRESERVISION AREDS 2 CAPS   Oral BID Shanda Howells, MD      . senna-docusate (Senokot-S) tablet 1 tablet  1 tablet Oral QHS PRN Shanda Howells, MD       Current Outpatient Prescriptions  Medication Sig Dispense Refill  . amLODipine (NORVASC) 10 MG tablet TAKE ONE TABLET BY MOUTH ONCE DAILY 30 tablet 6  . clobetasol cream (TEMOVATE) 2.22 % Apply 1 application topically as needed.     . clopidogrel (PLAVIX) 75 MG tablet TAKE ONE TABLET BY MOUTH ONCE DAILY 30 tablet 6  . hydrOXYzine (ATARAX/VISTARIL) 25 MG tablet Take 25 mg by mouth as needed.     . IBUPROFEN PO Take by mouth as needed.      . metoprolol tartrate (LOPRESSOR) 25 MG tablet TAKE ONE TABLET BY MOUTH TWICE DAILY 180 tablet 0  . Multiple Vitamins-Minerals (PRESERVISION AREDS 2 PO) Take by mouth 2 (two) times daily.    . nitroGLYCERIN (NITROSTAT) 0.4 MG SL tablet Place 1 tablet (0.4 mg total) under the tongue every 5 (five) minutes as needed. 25 tablet 11  . pravastatin (PRAVACHOL) 40 MG tablet Take 1 tablet (40 mg total) by mouth daily. 30 tablet 6  . triamcinolone ointment (KENALOG) 0.1 % as needed.     Review Of Systems: 12 point ROS negative except as noted above in HPI.  Physical Exam: Filed Vitals:   08/11/14 2100  BP: 128/68  Pulse: 65  Temp:   Resp: 16    General: cooperative and mildly confused  HEENT:  PERRLA, extra ocular movement intact and dry oral mucosa Heart: S1, S2 normal, no murmur, rub or gallop, regular rate and rhythm Lungs: clear to auscultation, no wheezes or rales and unlabored breathing Abdomen: abdomen is soft without significant tenderness, masses, organomegaly or guarding Extremities: extremities normal, atraumatic, no cyanosis or edema Skin:no rashes Neurology: diffuse generalized weakness, no focal abnormalities noted   Labs and Imaging: Lab Results  Component Value Date/Time   NA 136 08/11/2014 06:45 PM   K 4.5 08/11/2014 06:45 PM   CL 104 08/11/2014 06:45 PM   CO2 23 08/11/2014 06:45 PM   BUN 18 08/11/2014 06:45 PM   CREATININE 1.71* 08/11/2014 06:45 PM   GLUCOSE 154* 08/11/2014 06:45 PM   Lab Results  Component Value Date   WBC 5.5 08/11/2014   HGB 12.5 08/11/2014   HCT 37.0 08/11/2014   MCV 88.5 08/11/2014   PLT 213 08/11/2014   Urinalysis    Component Value Date/Time   COLORURINE YELLOW 08/11/2014 1953   APPEARANCEUR CLEAR 08/11/2014 1953   LABSPEC 1.017 08/11/2014 1953   PHURINE 7.5 08/11/2014 1953   GLUCOSEU NEGATIVE 08/11/2014 1953   HGBUR NEGATIVE 08/11/2014 1953   HGBUR negative 01/15/2009 0914   BILIRUBINUR NEGATIVE 08/11/2014 [place] NEGATIVE 08/11/2014 1953   PROTEINUR NEGATIVE 08/11/2014 1953   UROBILINOGEN 1.0 08/11/2014 1953   NITRITE NEGATIVE 08/11/2014 1953   LEUKOCYTESUR NEGATIVE 08/11/2014 1953       Dg Chest 2 View  08/11/2014   CLINICAL DATA:  Patient with altered mental status.  EXAM: CHEST  2 VIEW  COMPARISON:  Chest radiograph 05/13/2011  FINDINGS: Stable cardiac and mediastinal contours with tortuosity and calcification of the thoracic aorta. Biapical pleural parenchymal thickening. Stable coarse interstitial pulmonary opacities. No large consolidative pulmonary opacity. Mid thoracic spine degenerative change.  IMPRESSION: No acute cardiopulmonary process.   Electronically Signed   By: Lovey Newcomer M.D.   On:  08/11/2014 19:05   Ct Head Wo Contrast  08/11/2014   CLINICAL DATA:  Mental status change  EXAM: CT HEAD WITHOUT CONTRAST  TECHNIQUE: Contiguous axial images were obtained from the base of the skull through the vertex without intravenous contrast.  COMPARISON:  05/09/2011  FINDINGS: Global atrophy. Chronic ischemic changes in the periventricular white matter. No mass effect, midline shift, or acute hemorrhage. Mastoid air cells and visualized paranasal sinuses are clear. Cranium is intact.  IMPRESSION: No acute intracranial pathology.   Electronically Signed   By: Maryclare Bean M.D.   On: 08/11/2014 20:08           Shanda Howells MD  Pager: [phone]

## 2014-08-12 ENCOUNTER — Observation Stay (HOSPITAL_COMMUNITY): Payer: Medicare Other

## 2014-08-12 DIAGNOSIS — G9340 Encephalopathy, unspecified: Secondary | ICD-10-CM

## 2014-08-12 DIAGNOSIS — N179 Acute kidney failure, unspecified: Secondary | ICD-10-CM

## 2014-08-12 DIAGNOSIS — G459 Transient cerebral ischemic attack, unspecified: Secondary | ICD-10-CM | POA: Diagnosis not present

## 2014-08-12 DIAGNOSIS — F0390 Unspecified dementia without behavioral disturbance: Secondary | ICD-10-CM

## 2014-08-12 DIAGNOSIS — I63311 Cerebral infarction due to thrombosis of right middle cerebral artery: Principal | ICD-10-CM

## 2014-08-12 DIAGNOSIS — I10 Essential (primary) hypertension: Secondary | ICD-10-CM

## 2014-08-12 DIAGNOSIS — E785 Hyperlipidemia, unspecified: Secondary | ICD-10-CM

## 2014-08-12 LAB — LIPID PANEL
CHOLESTEROL: 141 mg/dL (ref 0–200)
HDL: 51 mg/dL (ref >39)
LDL CALC: 75 mg/dL (ref 0–99)
Total CHOL/HDL Ratio: 2.8 RATIO
Triglycerides: 76 mg/dL (ref <150)
VLDL: 15 mg/dL (ref 0–40)

## 2014-08-12 LAB — BASIC METABOLIC PANEL
ANION GAP: 6 (ref 5–15)
BUN: 14 mg/dL (ref 6–23)
CALCIUM: 9 mg/dL (ref 8.4–10.5)
CO2: 26 mmol/L (ref 19–32)
CREATININE: 1.45 mg/dL — AB (ref 0.50–1.10)
Chloride: 106 mEq/L (ref 96–112)
GFR calc Af Amer: 36 mL/min — ABNORMAL LOW (ref >90)
GFR, EST NON AFRICAN AMERICAN: 31 mL/min — AB (ref >90)
Glucose, Bld: 114 mg/dL — ABNORMAL HIGH (ref 70–99)
Potassium: 4 mmol/L (ref 3.5–5.1)
Sodium: 138 mmol/L (ref 135–145)

## 2014-08-12 LAB — AMMONIA: AMMONIA: 19 umol/L (ref 11–32)

## 2014-08-12 LAB — HEMOGLOBIN A1C
HEMOGLOBIN A1C: 5.4 % (ref <5.7)
MEAN PLASMA GLUCOSE: 108 mg/dL (ref <117)

## 2014-08-12 NOTE — Evaluation (Signed)
Physical Therapy Evaluation Patient Details Name: [name] MRN: [id number] DOB: October 01, 1923 Today's Date: 08/12/2014   History of Present Illness  Patient is a 81 y/o female with PMH of HTN, hyperlipidemia, TIA, dementia, AAA, CAD, aortic insufficiency, brought in to ED due to transient confusion and dysarthria. Family reports pt was back to baseline after arriving to ED.  MRI-subcentimeter focus of reduced diffusion within the RIGHT hypothalamus and LEFT periatrial white matter, equivocal for acute ischemia without large vessel infarct. CT- negative.    Clinical Impression  Patient presents with functional limitations due to deficits listed in PT problem list (see below). Pt with generalized weakness, balance deficits and baseline cognitive deficits impacting safe mobility. Not sure of pt's PLOF or support at home as pt poor historian. Due to impaired cognition and balance deficits, pt requires 24/7 supervision at home for safety to decrease fall risk. Tolerated short distance ambulation to bathroom with Min A for support. Pt would benefit from skilled PT to improve transfers, gait, balance and mobility so pt can maximize independence and minimize fall risk prior to return home.    Follow Up Recommendations Home health PT;Supervision/Assistance - 24 hour    Equipment Recommendations  None recommended by PT    Recommendations for Other Services       Precautions / Restrictions Precautions Precautions: Fall Restrictions Weight Bearing Restrictions: No      Mobility  Bed Mobility Overal bed mobility: Needs Assistance Bed Mobility: Supine to Sit     Supine to sit: Supervision;HOB elevated     General bed mobility comments: Use of rails for support. Supervision for safety.   Transfers Overall transfer level: Needs assistance Equipment used: 1 person hand held assist Transfers: Sit to/from Stand Sit to Stand: Min guard         General transfer comment: Min guard to  stand from EOB. Stood from toilet Mod I after being told to call for help when done.  Ambulation/Gait Ambulation/Gait assistance: Min assist Ambulation Distance (Feet): 15 Feet (x2 bouts) Assistive device: 2 person hand held assist;None Gait Pattern/deviations: Step-through pattern;Decreased stride length;Narrow base of support Gait velocity: slow   General Gait Details: Slow, unsteady gait requiring 2 person hand held assist for support to ambulate to bathroom progressing to no hand held support on way to chair. Increased knee flexion bil.   Stairs            Wheelchair Mobility    Modified Rankin (Stroke Patients Only)       Balance Overall balance assessment: Needs assistance Sitting-balance support: Feet supported;No upper extremity supported Sitting balance-Leahy Scale: Fair     Standing balance support: During functional activity Standing balance-Leahy Scale: Fair                               Pertinent Vitals/Pain Pain Assessment: No/denies pain    Home Living Family/patient expects to be discharged to:: Private residence Living Arrangements: Children Available Help at Discharge: Family;Available PRN/intermittently Type of Home: House Home Access: Stairs to enter Entrance Stairs-Rails: Right Entrance Stairs-Number of Steps: 6 vs 2 Home Layout: One level Home Equipment: Walker - 2 wheels;Cane - single point;Bedside commode      Prior Function Level of Independence: Independent with assistive device(s)         Comments: Pt reports (I) for ADLs. Reports daughter does IADLS. Pt reports being home alone sometimes. Per MD notes, pt uses SPC for ambulation. Pt  not great historian secondary to dementia and no family members present to provide history/PLOF.     Hand Dominance   Dominant Hand:  (unknown, suspect right handed as pt used right hand to hold cup)    Extremity/Trunk Assessment   Upper Extremity Assessment: Defer to OT evaluation            Lower Extremity Assessment: Generalized weakness;Difficult to assess due to impaired cognition         Communication      Cognition Arousal/Alertness: Awake/alert Behavior During Therapy: WFL for tasks assessed/performed Overall Cognitive Status: No family/caregiver present to determine baseline cognitive functioning       Memory: Decreased short-term memory              General Comments General comments (skin integrity, edema, etc.): Pt not able to correctly state how to utilize call bell to ask for help even after being instructed from previous session with Speech therapy.     Exercises        Assessment/Plan    PT Assessment Patient needs continued PT services  PT Diagnosis Difficulty walking;Generalized weakness   PT Problem List Decreased strength;Decreased cognition;Decreased mobility;Decreased safety awareness;Decreased balance  PT Treatment Interventions Balance training;Gait training;Patient/family education;Functional mobility training;Therapeutic activities;Therapeutic exercise;Stair training;DME instruction   PT Goals (Current goals can be found in the Care Plan section) Acute Rehab PT Goals Patient Stated Goal: none stated, "how long am I going to be here?" PT Goal Formulation: With patient Time For Goal Achievement: 08/26/14 Potential to Achieve Goals: Fair    Frequency Min 3X/week   Barriers to discharge Decreased caregiver support Not sure of level of support at home as pt poor historian.    Co-evaluation               End of Session Equipment Utilized During Treatment: Gait belt Activity Tolerance: Patient tolerated treatment well Patient left: in chair;with call bell/phone within reach;with chair alarm set Nurse Communication: Mobility status    Functional Assessment Tool Used: Clinical judgment Functional Limitation: Mobility: Walking and moving around Mobility: Walking and Moving Around Current Status (R4854): At least  20 percent but less than 40 percent impaired, limited or restricted Mobility: Walking and Moving Around Goal Status [phone]): At least 1 percent but less than 20 percent impaired, limited or restricted    Time: 0912-0930 PT Time Calculation (min) (ACUTE ONLY): 18 min   Charges:   PT Evaluation $Initial PT Evaluation Tier I: 1 Procedure PT Treatments $Therapeutic Activity: 8-22 mins   PT G Codes:   PT G-Codes **NOT FOR INPATIENT CLASS** Functional Assessment Tool Used: Clinical judgment Functional Limitation: Mobility: Walking and moving around Mobility: Walking and Moving Around Current Status (J0093): At least 20 percent but less than 40 percent impaired, limited or restricted Mobility: Walking and Moving Around Goal Status [phone]): At least 1 percent but less than 20 percent impaired, limited or restricted    Candy Sledge A 08/12/2014, 9:38 AM Candy Sledge, PT, DPT [phone]

## 2014-08-12 NOTE — Progress Notes (Signed)
STROKE TEAM PROGRESS NOTE   HISTORY OF PRESENT ILLNESS [name] is an 81 y.o. female with a past medical history significant for HTN, hyperlipidemia, TIA, dementia, AAA, CAD, aortic insufficiency, brought in for further evaluation of the above stated symptoms. She tells me that she is in the hospital " because I got confused for a while today". She said that she doesn't get confused and denies having HA, vertigo, double vision, focal weakness or numbness,slurred speech, language or vision difficulty. Family is not available at this moment and according to chart review " Daughter states at baseline patient is able to communicate well and knows her name and where she is. Daughter states half hour ago she started muttering words that did not make sense. Prior to that she was in her usual state of health had no recent illness. Daughter states patient normally can ambulate with use of a cane and is now very weak". Patient's symptoms resolved while still in the ED. She had a CT brain that I reviewed myself and showed no acute intracranial abnormality. Metabolic panel significant for slightly elevated creatinine 1.7, glucose 154, normal ammonia, and unremarkable CBC. At this moment, she is alert awake, oriented to place-person-and situation. Takes plavix daily. Date last known well: 08/11/17 Time last known well: around 5 pm tPA Given: no, symptoms resolved   SUBJECTIVE (INTERVAL HISTORY) No family is at the bedside.  Overall she feels her condition is completely resolved. She has baseline dementia and does not know why she is in hospital.   OBJECTIVE Temp:  [97.6 F (36.4 C)-98.6 F (37 C)] 97.6 F (36.4 C) (01/19 2102) Pulse Rate:  [61-69] 64 (01/19 2102) Cardiac Rhythm:  [-] Heart block (01/19 2014) Resp:  [16-18] 16 (01/19 2102) BP: (133-146)/(48-98) 133/51 mmHg (01/19 2102) SpO2:  [94 %-99 %] 95 % (01/19 2102)   Recent Labs Lab 08/11/14 1833  GLUCAP 154*    Recent Labs Lab  08/11/14 1845 08/12/14 1243  NA 136 138  K 4.5 4.0  CL 104 106  CO2 23 26  GLUCOSE 154* 114*  BUN 18 14  CREATININE 1.71* 1.45*  CALCIUM 8.9 9.0    Recent Labs Lab 08/11/14 1845  AST 21  ALT 10  ALKPHOS 74  BILITOT 0.5  PROT 7.1  ALBUMIN 3.8    Recent Labs Lab 08/11/14 1845  WBC 5.5  HGB 12.5  HCT 37.0  MCV 88.5  PLT 213   No results for input(s): CKTOTAL, CKMB, CKMBINDEX, TROPONINI in the last 168 hours.  Recent Labs  08/11/14 1845  LABPROT 13.3  INR 1.00    Recent Labs  08/11/14 1953  COLORURINE YELLOW  LABSPEC 1.017  PHURINE 7.5  GLUCOSEU NEGATIVE  HGBUR NEGATIVE  BILIRUBINUR NEGATIVE  KETONESUR NEGATIVE  PROTEINUR NEGATIVE  UROBILINOGEN 1.0  NITRITE NEGATIVE  LEUKOCYTESUR NEGATIVE       Component Value Date/Time   CHOL 141 08/12/2014 0541   TRIG 76 08/12/2014 0541   HDL 51 08/12/2014 0541   CHOLHDL 2.8 08/12/2014 0541   VLDL 15 08/12/2014 0541   LDLCALC 75 08/12/2014 0541   Lab Results  Component Value Date   HGBA1C 5.4 08/12/2014   No results found for: LABOPIA, COCAINSCRNUR, LABBENZ, AMPHETMU, THCU, LABBARB  No results for input(s): ETH in the last 168 hours.  I have personally reviewed the radiological images below and agree with the radiology interpretations.  Dg Chest 2 View  08/11/2014   IMPRESSION: No acute cardiopulmonary process.  Ct Head Wo Contrast  08/11/2014    IMPRESSION: No acute intracranial pathology.     Mri and Mra Brain Wo Contrast   08/11/2014    IMPRESSION: MRI HEAD: Motion degraded examination limits evaluation, subcentimeter focus of reduced diffusion within the RIGHT hypothalamus and LEFT periatrial white matter, equivocal for acute ischemia, without large vascular territory infarct.  Moderate to severe global brain atrophy, moderate to severe white matter changes suggest chronic small vessel ischemic disease. Remote small LEFT cerebellar and LEFT basal ganglia infarcts.  MRA HEAD: Mildly motion degraded  examination. No acute vascular process or high-grade stenosis.  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   Electronically Signed   By: Elon Alas   On: 08/11/2014 22:47   Carotid Doppler  pending  2D Echocardiogram  pending  EEG - This is an abnormal EEG secondary to posterior background slowing. This finding may be seen with a diffuse gray matter disturbance that is etiologically nonspecific, but may include a dementia, among other possibilities. No epileptiform discharges or electrographic seizures noted. The absence does not rule out a clinical diagnosis of epilepsy.   PHYSICAL EXAM  Temp:  [97.6 F (36.4 C)-98.6 F (37 C)] 97.6 F (36.4 C) (01/19 2102) Pulse Rate:  [61-69] 64 (01/19 2102) Resp:  [16-18] 16 (01/19 2102) BP: (133-146)/(48-98) 133/51 mmHg (01/19 2102) SpO2:  [94 %-99 %] 95 % (01/19 2102)  General - Well nourished, well developed, in no apparent distress.  Ophthalmologic - not cooperative on exam and kept asking me to leave her alone  Cardiovascular - Regular rate and rhythm with no murmur.  Neck - supple, no carotid bruits  Neuro - awake, alert, but not orientated to place and time as well as president. Memory and attention concentration are dramatically worsened. Mild dysarthria. PERRL, EOMI, face symmetrical and tongue in middle. Moving all extremities with at least 5-/5. Reflex 1+ and no babinski. Sensation not cooperative and not able to test coordination and gait.   ASSESSMENT/PLAN [name] is a 81 y.o. female with history of HTN, hyperlipidemia, TIA, dementia, AAA, CAD, aortic insufficiency presenting with transient confusion and dysarthria. She did not receive IV t-PA due to resolution of symptoms.   Stroke: acute R hypothalamus and subacute L periventricular white matter tiny infarcts, secondary to small vessel disease  source  Resultant back to baseline  MRI see above  MRA large vessel asthero  Carotid Doppler pending  2D Echo pending  HgbA1c 5.4 at the goal  lovenox subq for VTE prophylaxis  Diet Heart   clopidogrel 75 mg orally every day prior to admission, now on clopidogrel 75 mg orally every day  Patient counseled to be compliant with her antithrombotic medications  Ongoing aggressive stroke risk factor management  Therapy recommendations: pending  Disposition: pending  Hypertension  Home meds: amlodipine  Stable Permissive hypertension (OK if <220/120) for 24-48 hours post stroke and then gradually normalized within 5-7 days.  Patient counseled to be compliant with her blood pressure medications  Hyperlipidemia  Home meds: pravastatin resumed in hospital  LDL 75, goal < 70  continue statin at discharge  Other Stroke Risk Factors  Advanced age  Former Cigarette smoker, quit 3 yrs ago  Hx stroke/TIA   Other Active Problems  AKI   AAA  Hospital day # 1  Rosalin Hawking, MD PhD Stroke Neurology 08/12/2014 11:06 PM    To contact Stroke  Continuity provider, please refer to [email or web address] After hours, contact General Neurology

## 2014-08-12 NOTE — Progress Notes (Signed)
EEG Completed; Results Pending  

## 2014-08-12 NOTE — Progress Notes (Signed)
UR completed 

## 2014-08-12 NOTE — Evaluation (Addendum)
Speech Language Pathology Evaluation Patient Details Name: AKESHA URESTI MRN: [id number] DOB: April 30, 1924 Today's Date: 08/12/2014 Time: 0850-0909 SLP Time Calculation (min) (ACUTE ONLY): 19 min  Problem List:  Patient Active Problem List   Diagnosis Date Noted  . Encephalopathy 08/11/2014  . Carotid stenosis 04/25/2014  . Abdominal aneurysm without mention of rupture 09/23/2011  . AAA (abdominal aortic aneurysm) without rupture 06/24/2011  . TIA (transient ischemic attack) 05/13/2011  . AAA (abdominal aortic aneurysm) 05/13/2011  . Aortic insufficiency   . OSTEOPENIA 01/15/2009  . WEIGHT LOSS 01/15/2009  . HYPERLIPIDEMIA 05/01/2007  . HYPERTENSION 05/01/2007  . CORONARY ARTERY DISEASE 05/01/2007   Past Medical History:  Past Medical History  Diagnosis Date  . DJD (degenerative joint disease)   . CAD (coronary artery disease)   . Hypertension   . Hyperlipidemia   . Abdominal aortic aneurysm   . Osteopenia   . Aortic insufficiency   . Broken hip     left  . Stroke 2013  . Carotid artery occlusion    Past Surgical History:  Past Surgical History  Procedure Laterality Date  . Cataract extraction    . Abdominal hysterectomy    . Cholecystectomy    . Tonsillectomy    . Ptca    . Appendectomy    . Cardiac catheterization  2000    PTCA,and stenting to RCA  . Hip fracture surgery      Left hip ORIF  . Fracture surgery Left   . Eye surgery     HPI:  81 yo female adm to [facility] with dysarthria and transient confusion.   Per neurology review of chart "Daughter states at baseline patient is able to communicate well and knows her name and where she is. Daughter states half hour ago she started muttering words that did not make sense. Prior to that she was in her usual state of health had no recent illness. Daughter states patient normally can ambulate with use of a cane and is now very weak".  CT imaging negative, MRI ischemia remote cvas.     Assessment / Plan /  Recommendation Clinical Impression  Pt presents with severe cognitive deficits impacting memory, attention, problem solving and orientation.    Uncertain of baseline given pt has dementia and no family is present.  She was oriented to self only and repeated multiple times that she did not know why she was here.  Language deficits noted also as pt was unable to name single items or select from written choice of 2.  She answers questions with direct phrases and can articulate her basic needs.    She did recall need to push call bell for assistance after 3 review sessions spaced approximately 3 minutes apart but doubt she will generalize due to her dementia.    SLP to follow up to help in determination of baseline cognitive status and develop compensation strategies if indicated.  Recommend 24/7 supervision due to level of cognitive deficits.    Informed pt of findings, recommendations and pt admited to more difficulties in communication currently.      SLP Assessment  Patient needs continued Speech Lanaguage Pathology Services    Follow Up Recommendations   (TBD)    Frequency and Duration min 1 x/week  1 week   Pertinent Vitals/Pain Pain Assessment: No/denies pain   SLP Goals  Patient/Family Stated Goal: pt awake in bed Potential to Achieve Goals (ACUTE ONLY): Fair Potential Considerations (ACUTE ONLY): Ability to learn/carryover information;Previous level of function;Cooperation/participation  level  SLP Evaluation Prior Functioning  Cognitive/Linguistic Baseline: Baseline deficits Baseline deficit details: pt has baseline dementia but uncertain to extent as no family is present   Cognition  Overall Cognitive Status: No family/caregiver present to determine baseline cognitive functioning Arousal/Alertness: Awake/alert Orientation Level: Oriented to person;Disoriented to situation;Disoriented to time;Disoriented to place Attention: Focused Memory: Impaired Memory Impairment: Storage  deficit;Retrieval deficit;Decreased recall of new information;Decreased long term memory;Prospective memory;Decreased short term memory Decreased Short Term Memory: Verbal basic;Functional basic Awareness: Impaired Safety/Judgment: Impaired Comments: retention of need to push call bell for assist successful after 3 attempts with spacing of review approximately 3 minutes each, doubt pt will generalize however due to her baseline dementia    Comprehension  Auditory Comprehension Overall Auditory Comprehension: Impaired Yes/No Questions: Not tested Commands: Impaired One Step Basic Commands: 75-100% accurate Two Step Basic Commands: 50-74% accurate Conversation: Simple Interfering Components: Attention;Hearing;Processing speed;Working Field seismologist: Extra processing time;Increased volume;Repetition;Slowed speech Visual Recognition/Discrimination Discrimination: Not tested Reading Comprehension Reading Status: Impaired Word level: Impaired (unable to read single large print word or match word to item, pt states her vision is bad ) Sentence Level: Not tested Paragraph Level: Not tested Functional Environmental (signs, name badge): Not tested Interfering Components:  (pt reports vision deficits)    Expression Expression Primary Mode of Expression: Verbal Verbal Expression Overall Verbal Expression: Impaired Initiation: No impairment Level of Generative/Spontaneous Verbalization: Phrase Repetition: No impairment Naming: Impairment Responsive: 0-25% accurate Other Naming Comments: 1/3 items named correctly (of personal significance as pt was using item at the time) Pragmatics: Impairment Impairments: Dysprosody;Eye contact;Abnormal affect Other Verbal Expression Comments: pt answers questions only in brief phrases but does not expand information Written Expression Dominant Hand:  (unknown, suspect right handed as pt used right hand to hold cup) Written Expression: Not  tested   Oral / Motor Oral Motor/Sensory Function Overall Oral Motor/Sensory Function: Appears within functional limits for tasks assessed (no focal CN deficits from tasks completed, sluggish palatal elevation and generalized weakness) Motor Speech Respiration: Impaired Phonation: Low vocal intensity Resonance: Within functional limits Articulation: Impaired Level of Impairment: Phrase Intelligibility: Intelligible   GO Functional Assessment Tool Used: clinical judgement Functional Limitations: Spoken language expressive Spoken Language Expression Current Status (I6270): At least 60 percent but less than 80 percent impaired, limited or restricted Spoken Language Expression Goal Status [phone]): At least 40 percent but less than 60 percent impaired, limited or restricted   Luanna Salk, [facility] SLP [phone]

## 2014-08-12 NOTE — Consult Note (Addendum)
Referring Physician: ED    Chief Complaint: transient confusion and dysarthria  HPI:                                                                                                                                         [name] is an 81 y.o. female with a past medical history significant for HTN, hyperlipidemia, TIA, dementia, AAA, CAD, aortic insufficiency, brought in for further evaluation of the above stated symptoms. She tells me that she is in the hospital " because I got confused for a while today". She said that she doesn't get confused and denies having HA, vertigo, double vision, focal weakness or numbness,slurred speech, language or vision difficulty. Family is not available at this moment and according to chart review " Daughter states at baseline patient is able to communicate well and knows her name and where she is. Daughter states half hour ago she started muttering words that did not make sense. Prior to that she was in her usual state of health had no recent illness. Daughter states patient normally can ambulate with use of a cane and is now very weak". Patient's symptoms resolved while still in the ED. She had a CT brain that I reviewed myself and showed no acute intracranial abnormality. Metabolic panel significant for slightly elevated creatinine 1.7, glucose 154, normal ammonia, and unremarkable CBC. At this moment, she is alert awake, oriented to place-person-and situation. Takes plavix daily. Date last known well: 08/11/17 Time last known well: around 5 pm tPA Given: no, symptoms resolved   Past Medical History  Diagnosis Date  . DJD (degenerative joint disease)   . CAD (coronary artery disease)   . Hypertension   . Hyperlipidemia   . Abdominal aortic aneurysm   . Osteopenia   . Aortic insufficiency   . Broken hip     left  . Stroke 2013  . Carotid artery occlusion     Past Surgical History  Procedure Laterality Date  . Cataract extraction    .  Abdominal hysterectomy    . Cholecystectomy    . Tonsillectomy    . Ptca    . Appendectomy    . Cardiac catheterization  2000    PTCA,and stenting to RCA  . Hip fracture surgery      Left hip ORIF  . Fracture surgery Left   . Eye surgery     Family history: denies brain tumors, epilepsy, or brain aneurysms Family History  Problem Relation Age of Onset  . Coronary artery disease Mother   . Heart disease Mother   . Dementia Sister   . Heart disease Sister   . Coronary artery disease Brother   . Cancer Sister     Cervical or Ovarian   Social History:  reports that she quit smoking about 3 years ago. Her smoking use included Cigarettes. She quit after 50 years of  use. She has never used smokeless tobacco. She reports that she does not drink alcohol or use illicit drugs.  Allergies:  Allergies  Allergen Reactions  . Alendronate Sodium   . Chlordiazepoxide   . Librium   . Zocor [Simvastatin]     Leg cramps    Medications:                                                                                                                           Scheduled: . amLODipine  10 mg Oral Daily  . clopidogrel  75 mg Oral Daily  . enoxaparin (LOVENOX) injection  30 mg Subcutaneous Q24H  . metoprolol tartrate  25 mg Oral BID  . multivitamin with minerals  1 tablet Oral BID  . pravastatin  40 mg Oral Daily    ROS:                                                                                                                                       History obtained from chart review and patient  General ROS: negative for - chills, fatigue, fever, night sweats, weight gain or weight loss Psychological ROS: negative for - behavioral disorder, hallucinations, mood swings or suicidal ideation Ophthalmic ROS: negative for - blurry vision, double vision, eye pain or loss of vision ENT ROS: negative for - epistaxis, nasal discharge, oral lesions, sore throat, tinnitus or vertigo Allergy and  Immunology ROS: negative for - hives or itchy/watery eyes Hematological and Lymphatic ROS: negative for - bleeding problems, bruising or swollen lymph nodes Endocrine ROS: negative for - galactorrhea, hair pattern changes, polydipsia/polyuria or temperature intolerance Respiratory ROS: negative for - cough, hemoptysis, shortness of breath or wheezing Cardiovascular ROS: negative for - chest pain, dyspnea on exertion, edema or irregular heartbeat Gastrointestinal ROS: negative for - abdominal pain, diarrhea, hematemesis, nausea/vomiting or stool incontinence Genito-Urinary ROS: negative for - dysuria, hematuria, incontinence or urinary frequency/urgency Musculoskeletal ROS: negative for - joint swelling or muscular weakness Neurological ROS: as noted in HPI Dermatological ROS: negative for rash and skin lesion changes  Physical exam: pleasant female in no apparent distress. Blood pressure 153/81, pulse 85, temperature 98.1 F (36.7 C), temperature source Oral, resp. rate 18, height _0  (1.626 m), weight 51.4 kg (113 lb 5.1 oz), SpO2 96 %. Head: normocephalic. Neck: supple, no bruits, no JVD. Cardiac: no murmurs. Lungs: clear. Abdomen:  soft, no tender, no mass. Extremities: no edema. Skin: no rash  Neurologic Examination:                                                                                                      General: Mental Status: Alert, oriented to place but disoriented to year-month-day, can not perform serial 7's.  Speech fluent without evidence of aphasia.  Able to follow 3 step commands without difficulty. Cranial Nerves: II: Discs flat bilaterally; Visual fields grossly normal, pupils equal, round, reactive to light and accommodation III,IV, VI: ptosis not present, extra-ocular motions intact bilaterally V,VII: smile symmetric, facial light touch sensation normal bilaterally VIII: hearing normal bilaterally IX,X: gag reflex present XI: bilateral shoulder shrug XII:  midline tongue extension without atrophy or fasciculations Motor: Right : Upper extremity   5/5    Left:     Upper extremity   5/5  Lower extremity   5/5     Lower extremity   5/5 Tone and bulk:normal tone throughout; no atrophy noted Sensory: Pinprick and light touch intact throughout, bilaterally Deep Tendon Reflexes:  Right: Upper Extremity   Left: Upper extremity   biceps (C-5 to C-6) 2/4   biceps (C-5 to C-6) 2/4 tricep (C7) 2/4    triceps (C7) 2/4 Brachioradialis (C6) 2/4  Brachioradialis (C6) 2/4  Lower Extremity Lower Extremity  quadriceps (L-2 to L-4) 2/4   quadriceps (L-2 to L-4) 2/4 Achilles (S1) 2/4   Achilles (S1) 2/4  Plantars: Right: downgoing   Left: downgoing Cerebellar: normal finger-to-nose,  normal heel-to-shin test Gait:  No tested due to safety reasons CV: pulses palpable throughout    Results for orders placed or performed during the hospital encounter of 08/11/14 (from the past 48 hour(s))  CBG monitoring, ED     Status: Abnormal   Collection Time: 08/11/14  6:33 PM  Result Value Ref Range   Glucose-Capillary 154 (H) 70 - 99 mg/dL  CBC     Status: None   Collection Time: 08/11/14  6:45 PM  Result Value Ref Range   WBC 5.5 4.0 - 10.5 K/uL   RBC 4.18 3.87 - 5.11 MIL/uL   Hemoglobin 12.5 12.0 - 15.0 g/dL   HCT 37.0 36.0 - 46.0 %   MCV 88.5 78.0 - 100.0 fL   MCH 29.9 26.0 - 34.0 pg   MCHC 33.8 30.0 - 36.0 g/dL   RDW 12.9 11.5 - 15.5 %   Platelets 213 150 - 400 K/uL  Comprehensive metabolic panel     Status: Abnormal   Collection Time: 08/11/14  6:45 PM  Result Value Ref Range   Sodium 136 135 - 145 mmol/L    Comment: Please note change in reference range.   Potassium 4.5 3.5 - 5.1 mmol/L    Comment: Please note change in reference range.   Chloride 104 96 - 112 mEq/L   CO2 23 19 - 32 mmol/L   Glucose, Bld 154 (H) 70 - 99 mg/dL   BUN 18 6 - 23 mg/dL   Creatinine, Ser 1.71 (H) 0.50 - 1.10 mg/dL  Calcium 8.9 8.4 - 10.5 mg/dL   Total Protein  7.1 6.0 - 8.3 g/dL   Albumin 3.8 3.5 - 5.2 g/dL   AST 21 0 - 37 U/L   ALT 10 0 - 35 U/L   Alkaline Phosphatase 74 39 - 117 U/L   Total Bilirubin 0.5 0.3 - 1.2 mg/dL   GFR calc non Af Amer 26 (L) >90 mL/min   GFR calc Af Amer 30 (L) >90 mL/min    Comment: (NOTE) The eGFR has been calculated using the CKD EPI equation. This calculation has not been validated in all clinical situations. eGFR's persistently <90 mL/min signify possible Chronic Kidney Disease.    Anion gap 9 5 - 15  Protime-INR (if patient is on an anticoagulant)     Status: None   Collection Time: 08/11/14  6:45 PM  Result Value Ref Range   Prothrombin Time 13.3 11.6 - 15.2 seconds   INR 1.00 0.00 - 1.49  Urinalysis, Routine w reflex microscopic     Status: None   Collection Time: 08/11/14  7:53 PM  Result Value Ref Range   Color, Urine YELLOW YELLOW   APPearance CLEAR CLEAR   Specific Gravity, Urine 1.017 1.005 - 1.030   pH 7.5 5.0 - 8.0   Glucose, UA NEGATIVE NEGATIVE mg/dL   Hgb urine dipstick NEGATIVE NEGATIVE   Bilirubin Urine NEGATIVE NEGATIVE   Ketones, ur NEGATIVE NEGATIVE mg/dL   Protein, ur NEGATIVE NEGATIVE mg/dL   Urobilinogen, UA 1.0 0.0 - 1.0 mg/dL   Nitrite NEGATIVE NEGATIVE   Leukocytes, UA NEGATIVE NEGATIVE    Comment: MICROSCOPIC NOT DONE ON URINES WITH NEGATIVE PROTEIN, BLOOD, LEUKOCYTES, NITRITE, OR GLUCOSE <1000 mg/dL.   Dg Chest 2 View  08/11/2014   CLINICAL DATA:  Patient with altered mental status.  EXAM: CHEST  2 VIEW  COMPARISON:  Chest radiograph 05/13/2011  FINDINGS: Stable cardiac and mediastinal contours with tortuosity and calcification of the thoracic aorta. Biapical pleural parenchymal thickening. Stable coarse interstitial pulmonary opacities. No large consolidative pulmonary opacity. Mid thoracic spine degenerative change.  IMPRESSION: No acute cardiopulmonary process.   Electronically Signed   By: Lovey Newcomer M.D.   On: 08/11/2014 19:05   Ct Head Wo Contrast  08/11/2014    CLINICAL DATA:  Mental status change  EXAM: CT HEAD WITHOUT CONTRAST  TECHNIQUE: Contiguous axial images were obtained from the base of the skull through the vertex without intravenous contrast.  COMPARISON:  05/09/2011  FINDINGS: Global atrophy. Chronic ischemic changes in the periventricular white matter. No mass effect, midline shift, or acute hemorrhage. Mastoid air cells and visualized paranasal sinuses are clear. Cranium is intact.  IMPRESSION: No acute intracranial pathology.   Electronically Signed   By: Maryclare Bean M.D.   On: 08/11/2014 20:08   Mr Brain Wo Contrast (neuro Protocol)  08/11/2014   CLINICAL DATA:  Encephalopathic, acute onset confusion and dysarthria and weakness. Now at baseline. History of dementia, transient ischemic attack, coronary artery disease and aortic insufficiency.  EXAM: MRI HEAD WITHOUT CONTRAST  MRA HEAD WITHOUT CONTRAST  TECHNIQUE: Multiplanar, multiecho pulse sequences of the brain and surrounding structures were obtained without intravenous contrast. Angiographic images of the head were obtained using MRA technique without contrast.  COMPARISON:  None.  FINDINGS: MRI HEAD FINDINGS  Motion degraded examination, nearly nondiagnostic gradient sequence. Subcentimeter focus of reduced diffusion within the RIGHT hypo thalamus and punctate focus of reduced diffusion within the LEFT periatrial white matter. These are not confirmed on the motion  degraded coronal sequence. Moderately motion degraded FLAIR sequence, severely motion degraded axial T1 sequence. Moderate to superior ventriculomegaly, likely on the basis of global parenchymal brain volume loss as there is overall commensurate enlargement of cerebral sulci and cerebellar folia. Remote LEFT cerebellar infarct, present previously. Remote LEFT basal ganglia lacunar infarct. Patchy to confluent supratentorial white matter T2 hyperintensities. No midline shift or mass effect.  No abnormal extra-axial fluid collections. Status  post bilateral ocular lens implants. The paranasal sinus air-fluid levels. The mastoid air cells appear well-aerated. No abnormal sellar expansion. No cerebellar tonsillar ectopia. Patient appears edentulous. No suspicious bone marrow signal.  MRA HEAD FINDINGS  Mildly motion degraded examination.  Anterior circulation: Normal flow related enhancement of the cervical, petrous, cavernous supra clinoid internal carotid arteries. However, there is a broad-based approximate 3 mm apparent outpouching laterally directed at the level of the LEFT cavernous carotid, axial 96/188. Dolicoectatic intracranial vessels. No flow related enhancement of these anterior and middle cerebral arteries.  Posterior circulation: The LEFT vertebral artery is dominant. Normal flow related into the vertebral arteries, basilar artery be branch vessels. Robust RIGHT posterior communicating with compensatory diminutive RIGHT P1 segment, a normal variant. Small LEFT posterior communicating artery. Flow related enhancement within the posterior cerebral arteries.  No large vessel occlusion, high-grade stenosis, suspicious luminal irregularity within the anterior or posterior circulation.  IMPRESSION: MRI HEAD: Motion degraded examination limits evaluation, subcentimeter focus of reduced diffusion within the RIGHT hypothalamus and LEFT periatrial white matter, equivocal for acute ischemia, without large vascular territory infarct.  Moderate to severe global brain atrophy, moderate to severe white matter changes suggest chronic small vessel ischemic disease. Remote small LEFT cerebellar and LEFT basal ganglia infarcts.  MRA HEAD: Mildly motion degraded examination. No acute vascular process or high-grade stenosis.  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   Electronically Signed   By: Elon Alas   On: 08/11/2014 22:47   Mr Jodene Nam  Head/brain Wo Cm  08/11/2014   CLINICAL DATA:  Encephalopathic, acute onset confusion and dysarthria and weakness. Now at baseline. History of dementia, transient ischemic attack, coronary artery disease and aortic insufficiency.  EXAM: MRI HEAD WITHOUT CONTRAST  MRA HEAD WITHOUT CONTRAST  TECHNIQUE: Multiplanar, multiecho pulse sequences of the brain and surrounding structures were obtained without intravenous contrast. Angiographic images of the head were obtained using MRA technique without contrast.  COMPARISON:  None.  FINDINGS: MRI HEAD FINDINGS  Motion degraded examination, nearly nondiagnostic gradient sequence. Subcentimeter focus of reduced diffusion within the RIGHT hypo thalamus and punctate focus of reduced diffusion within the LEFT periatrial white matter. These are not confirmed on the motion degraded coronal sequence. Moderately motion degraded FLAIR sequence, severely motion degraded axial T1 sequence. Moderate to superior ventriculomegaly, likely on the basis of global parenchymal brain volume loss as there is overall commensurate enlargement of cerebral sulci and cerebellar folia. Remote LEFT cerebellar infarct, present previously. Remote LEFT basal ganglia lacunar infarct. Patchy to confluent supratentorial white matter T2 hyperintensities. No midline shift or mass effect.  No abnormal extra-axial fluid collections. Status post bilateral ocular lens implants. The paranasal sinus air-fluid levels. The mastoid air cells appear well-aerated. No abnormal sellar expansion. No cerebellar tonsillar ectopia. Patient appears edentulous. No suspicious bone marrow signal.  MRA HEAD FINDINGS  Mildly motion degraded examination.  Anterior circulation: Normal flow related enhancement of the cervical, petrous, cavernous supra clinoid internal carotid arteries. However, there is  a broad-based approximate 3 mm apparent outpouching laterally directed at the level of the LEFT cavernous carotid, axial 96/188.  Dolicoectatic intracranial vessels. No flow related enhancement of these anterior and middle cerebral arteries.  Posterior circulation: The LEFT vertebral artery is dominant. Normal flow related into the vertebral arteries, basilar artery be branch vessels. Robust RIGHT posterior communicating with compensatory diminutive RIGHT P1 segment, a normal variant. Small LEFT posterior communicating artery. Flow related enhancement within the posterior cerebral arteries.  No large vessel occlusion, high-grade stenosis, suspicious luminal irregularity within the anterior or posterior circulation.  IMPRESSION: MRI HEAD: Motion degraded examination limits evaluation, subcentimeter focus of reduced diffusion within the RIGHT hypothalamus and LEFT periatrial white matter, equivocal for acute ischemia, without large vascular territory infarct.  Moderate to severe global brain atrophy, moderate to severe white matter changes suggest chronic small vessel ischemic disease. Remote small LEFT cerebellar and LEFT basal ganglia infarcts.  MRA HEAD: Mildly motion degraded examination. No acute vascular process or high-grade stenosis.  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   Electronically Signed   By: Elon Alas   On: 08/11/2014 22:47    Assessment: 81 y.o. female with several risk factors for stroke, brought in after sustaining a transient episode of confusion and speech disturbance. Non focal neuro-exam, unremarkable CT brain. Possible TIA. Seizure less likely. Can nott exclude the possibility of confusion and speech disturbance in the context of underlying dementia. MRI brain (patient had recent CUS), TTE, lipid profile. EEG. Continue plavix. Will follow up   Dorian Pod ,MD [facility] [phone]  08/12/2014, 12:14 AM

## 2014-08-12 NOTE — Procedures (Signed)
ELECTROENCEPHALOGRAM REPORT   Patient: [name]       Room #: 4N-11 EEG No. ID:  Age: 81 y.o.        Sex: female Referring Physician: Dr Erlinda Hong Report Date:  08/12/2014        Interpreting Physician: Jim Like, Larkin Ina  History: [name] is an 81 y.o. female admitted with transient confusion and dysarthria.  Medications:  Scheduled: . clopidogrel  75 mg Oral Daily  . enoxaparin (LOVENOX) injection  30 mg Subcutaneous Q24H  . metoprolol tartrate  25 mg Oral BID  . multivitamin with minerals  1 tablet Oral BID  . pravastatin  40 mg Oral Daily    Conditions of Recording:  This is a 16 channel EEG carried out with the patient in the drowsy state.  Description:  The waking background activity consists primarily of a low voltage, symmetrical, fairly well organized, theta activity in the 6-7Hz  range, seen from the parieto-occipital and posterior temporal regions.  Low voltage fast activity, poorly organized, is seen anteriorly and is at times superimposed on more posterior regions.  A mixture of theta and alpha rhythms are seen from the central and temporal regions. No focal slowing, epileptiform discharges or electrographic seizures are noted.  The patient drowses with slowing to irregular, low voltage theta and beta activity.  Stage II sleep is not obtained. Hyperventilation and photic stimulation were not performed.   IMPRESSION: This is an abnormal EEG secondary to posterior background slowing. This finding may be seen with a diffuse gray matter disturbance that is etiologically nonspecific, but may include a dementia, among other possibilities. No epileptiform discharges or electrographic seizures noted. The absence does not rule out a clinical diagnosis of epilepsy.     Jim Like, DO [facility] [phone]  If 7pm- 7am, please page neurology on call as listed in [place]. 08/12/2014, 12:48 PM

## 2014-08-12 NOTE — Progress Notes (Signed)
PROGRESS NOTE  [name] QPR:[id number] DOB: 03-12-1924 DOA: 08/11/2014 PCP: Nyoka Cowden, MD  HPI: This is a 81 y.o. year old female with significant past medical history of TIA, dementia, AAA, HTN, CAD, aortic insufficiency presenting with encephalopathy, ? TIA/CVA. Level V caveat as pt is encephalopathic. Primary history from EDP and nursing notes. Per report, pt was in otherwise normal state of health when she was noted to be confused/dysarthric and weak by pt's daughter. By the time pt presented to ER, family reported pt to be back to baseline. Pt currently denies any CP, SOB, nausea, vomiting, diarrhea, fever. Hemodynamically stable afebrile. CBC and CMET WNL apart from Cr 1.7 and Glu 154. Head CT, CXR, UA all WNL..   Subjective / 24 H Interval events - appears quite confused this morning, asking when she will be allowed to walk out  Assessment/Plan: Principal Problem:   TIA (transient ischemic attack) Active Problems:   Hyperlipidemia   Essential hypertension   Encephalopathy   AKI (acute kidney injury)  Acute Encephalopathy  - broad ddx and likely multifactorial in setting of hx/o TIA and dementia  - MRI done is equivocal regarding acute CVA - Neurology consulted - 2D echo, carotids, EEG pending  AKI  - mildly dry on exam, on IVF, recheck BMP today   CAD/HTN - permissive hypertension, amlodipine discontinued this morning  AAA - 4.9x4.8 cm distal aortic aneurysm on u/s 04/2014  - no pain   Diet: Diet Heart Fluids: NS DVT Prophylaxis: Lovenox  Code Status: Full Code Family Communication: none bedside  Disposition Plan: remain inpatient  Consultants:  Neurology   Procedures:  None    Antibiotics  Anti-infectives    None       Studies  Dg Chest 2 View  08/11/2014   CLINICAL DATA:  Patient with altered mental status.  EXAM: CHEST  2 VIEW  COMPARISON:  Chest radiograph 05/13/2011  FINDINGS: Stable cardiac and mediastinal  contours with tortuosity and calcification of the thoracic aorta. Biapical pleural parenchymal thickening. Stable coarse interstitial pulmonary opacities. No large consolidative pulmonary opacity. Mid thoracic spine degenerative change.  IMPRESSION: No acute cardiopulmonary process.   Electronically Signed   By: Lovey Newcomer M.D.   On: 08/11/2014 19:05   Ct Head Wo Contrast  08/11/2014   CLINICAL DATA:  Mental status change  EXAM: CT HEAD WITHOUT CONTRAST  TECHNIQUE: Contiguous axial images were obtained from the base of the skull through the vertex without intravenous contrast.  COMPARISON:  05/09/2011  FINDINGS: Global atrophy. Chronic ischemic changes in the periventricular white matter. No mass effect, midline shift, or acute hemorrhage. Mastoid air cells and visualized paranasal sinuses are clear. Cranium is intact.  IMPRESSION: No acute intracranial pathology.   Electronically Signed   By: [name] M.D.   On: 08/11/2014 20:08   Mr Brain Wo Contrast (neuro Protocol)  08/11/2014   CLINICAL DATA:  Encephalopathic, acute onset confusion and dysarthria and weakness. Now at baseline. History of dementia, transient ischemic attack, coronary artery disease and aortic insufficiency.  EXAM: MRI HEAD WITHOUT CONTRAST  MRA HEAD WITHOUT CONTRAST  TECHNIQUE: Multiplanar, multiecho pulse sequences of the brain and surrounding structures were obtained without intravenous contrast. Angiographic images of the head were obtained using MRA technique without contrast.  COMPARISON:  None.  FINDINGS: MRI HEAD FINDINGS  Motion degraded examination, nearly nondiagnostic gradient sequence. Subcentimeter focus of reduced diffusion within the RIGHT hypo thalamus and punctate focus of reduced diffusion within the LEFT periatrial white  matter. These are not confirmed on the motion degraded coronal sequence. Moderately motion degraded FLAIR sequence, severely motion degraded axial T1 sequence. Moderate to superior ventriculomegaly,  likely on the basis of global parenchymal brain volume loss as there is overall commensurate enlargement of cerebral sulci and cerebellar folia. Remote LEFT cerebellar infarct, present previously. Remote LEFT basal ganglia lacunar infarct. Patchy to confluent supratentorial white matter T2 hyperintensities. No midline shift or mass effect.  No abnormal extra-axial fluid collections. Status post bilateral ocular lens implants. The paranasal sinus air-fluid levels. The mastoid air cells appear well-aerated. No abnormal sellar expansion. No cerebellar tonsillar ectopia. Patient appears edentulous. No suspicious bone marrow signal.  MRA HEAD FINDINGS  Mildly motion degraded examination.  Anterior circulation: Normal flow related enhancement of the cervical, petrous, cavernous supra clinoid internal carotid arteries. However, there is a broad-based approximate 3 mm apparent outpouching laterally directed at the level of the LEFT cavernous carotid, axial 96/188. Dolicoectatic intracranial vessels. No flow related enhancement of these anterior and middle cerebral arteries.  Posterior circulation: The LEFT vertebral artery is dominant. Normal flow related into the vertebral arteries, basilar artery be branch vessels. Robust RIGHT posterior communicating with compensatory diminutive RIGHT P1 segment, a normal variant. Small LEFT posterior communicating artery. Flow related enhancement within the posterior cerebral arteries.  No large vessel occlusion, high-grade stenosis, suspicious luminal irregularity within the anterior or posterior circulation.  IMPRESSION: MRI HEAD: Motion degraded examination limits evaluation, subcentimeter focus of reduced diffusion within the RIGHT hypothalamus and LEFT periatrial white matter, equivocal for acute ischemia, without large vascular territory infarct.  Moderate to severe global brain atrophy, moderate to severe white matter changes suggest chronic small vessel ischemic disease. Remote  small LEFT cerebellar and LEFT basal ganglia infarcts.  MRA HEAD: Mildly motion degraded examination. No acute vascular process or high-grade stenosis.  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   Electronically Signed   By: Elon Alas   On: 08/11/2014 22:47   Mr Jodene Nam Head/brain Wo Cm  08/11/2014   CLINICAL DATA:  Encephalopathic, acute onset confusion and dysarthria and weakness. Now at baseline. History of dementia, transient ischemic attack, coronary artery disease and aortic insufficiency.  EXAM: MRI HEAD WITHOUT CONTRAST  MRA HEAD WITHOUT CONTRAST  TECHNIQUE: Multiplanar, multiecho pulse sequences of the brain and surrounding structures were obtained without intravenous contrast. Angiographic images of the head were obtained using MRA technique without contrast.  COMPARISON:  None.  FINDINGS: MRI HEAD FINDINGS  Motion degraded examination, nearly nondiagnostic gradient sequence. Subcentimeter focus of reduced diffusion within the RIGHT hypo thalamus and punctate focus of reduced diffusion within the LEFT periatrial white matter. These are not confirmed on the motion degraded coronal sequence. Moderately motion degraded FLAIR sequence, severely motion degraded axial T1 sequence. Moderate to superior ventriculomegaly, likely on the basis of global parenchymal brain volume loss as there is overall commensurate enlargement of cerebral sulci and cerebellar folia. Remote LEFT cerebellar infarct, present previously. Remote LEFT basal ganglia lacunar infarct. Patchy to confluent supratentorial white matter T2 hyperintensities. No midline shift or mass effect.  No abnormal extra-axial fluid collections. Status post bilateral ocular lens implants. The paranasal sinus air-fluid levels. The mastoid air cells appear well-aerated. No abnormal sellar expansion. No cerebellar tonsillar ectopia. Patient  appears edentulous. No suspicious bone marrow signal.  MRA HEAD FINDINGS  Mildly motion degraded examination.  Anterior circulation: Normal flow related enhancement of the cervical, petrous,  cavernous supra clinoid internal carotid arteries. However, there is a broad-based approximate 3 mm apparent outpouching laterally directed at the level of the LEFT cavernous carotid, axial 96/188. Dolicoectatic intracranial vessels. No flow related enhancement of these anterior and middle cerebral arteries.  Posterior circulation: The LEFT vertebral artery is dominant. Normal flow related into the vertebral arteries, basilar artery be branch vessels. Robust RIGHT posterior communicating with compensatory diminutive RIGHT P1 segment, a normal variant. Small LEFT posterior communicating artery. Flow related enhancement within the posterior cerebral arteries.  No large vessel occlusion, high-grade stenosis, suspicious luminal irregularity within the anterior or posterior circulation.  IMPRESSION: MRI HEAD: Motion degraded examination limits evaluation, subcentimeter focus of reduced diffusion within the RIGHT hypothalamus and LEFT periatrial white matter, equivocal for acute ischemia, without large vascular territory infarct.  Moderate to severe global brain atrophy, moderate to severe white matter changes suggest chronic small vessel ischemic disease. Remote small LEFT cerebellar and LEFT basal ganglia infarcts.  MRA HEAD: Mildly motion degraded examination. No acute vascular process or high-grade stenosis.  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   Electronically Signed   By: Elon Alas   On: 08/11/2014 22:47    Objective  Filed Vitals:   08/12/14 0100 08/12/14 0300 08/12/14 0500 08/12/14 0700  BP: 139/59 143/98 146/57 140/68  Pulse: 61 66 64 63  Temp: 98.2 F (36.8 C) 98.6 F (37 C) 97.9 F  (36.6 C) 98.2 F (36.8 C)  TempSrc: Oral Oral Oral Oral  Resp: 18 16 16 18   Height:      Weight:      SpO2: 96% 95% 95% 95%    Intake/Output Summary (Last 24 hours) at 08/12/14 0845 Last data filed at 08/12/14 0700  Gross per 24 hour  Intake    120 ml  Output    430 ml  Net   -310 ml   Filed Weights   08/11/14 2300  Weight: 51.4 kg (113 lb 5.1 oz)    Exam:  General:  NAD, alert, confused  HEENT: no scleral icterus  Cardiovascular: RRR without MRG  Respiratory: CTA biL, no wheezing  Abdomen: soft, non tender  MSK/Extremities: no clubbing  Skin: no rashes  Neuro: non focal, intermittently following commands but not consistently  Data Reviewed: Basic Metabolic Panel:  Recent Labs Lab 08/11/14 1845  NA 136  K 4.5  CL 104  CO2 23  GLUCOSE 154*  BUN 18  CREATININE 1.71*  CALCIUM 8.9   Liver Function Tests:  Recent Labs Lab 08/11/14 1845  AST 21  ALT 10  ALKPHOS 74  BILITOT 0.5  PROT 7.1  ALBUMIN 3.8    Recent Labs Lab 08/11/14 2313  AMMONIA 19   CBC:  Recent Labs Lab 08/11/14 1845  WBC 5.5  HGB 12.5  HCT 37.0  MCV 88.5  PLT 213   CBG:  Recent Labs Lab 08/11/14 1833  GLUCAP 154*   Scheduled Meds: . amLODipine  10 mg Oral Daily  . clopidogrel  75 mg Oral Daily  . enoxaparin (LOVENOX) injection  30 mg Subcutaneous Q24H  . metoprolol tartrate  25 mg Oral BID  . multivitamin with minerals  1 tablet Oral BID  . pravastatin  40 mg Oral Daily   Continuous Infusions: . sodium chloride 100 mL/hr at 08/11/14 2318    Marzetta Board, MD [facility] Pager [phone]. If 7 PM - 7 AM, please contact night-coverage  at [email or web address], password [email or web address] 08/12/2014, 8:45 AM  LOS: 1 day

## 2014-08-13 DIAGNOSIS — I63311 Cerebral infarction due to thrombosis of right middle cerebral artery: Secondary | ICD-10-CM | POA: Diagnosis not present

## 2014-08-13 DIAGNOSIS — R471 Dysarthria and anarthria: Secondary | ICD-10-CM | POA: Diagnosis present

## 2014-08-13 DIAGNOSIS — Z7901 Long term (current) use of anticoagulants: Secondary | ICD-10-CM | POA: Diagnosis not present

## 2014-08-13 DIAGNOSIS — I351 Nonrheumatic aortic (valve) insufficiency: Secondary | ICD-10-CM | POA: Diagnosis present

## 2014-08-13 DIAGNOSIS — I2510 Atherosclerotic heart disease of native coronary artery without angina pectoris: Secondary | ICD-10-CM | POA: Diagnosis present

## 2014-08-13 DIAGNOSIS — F0390 Unspecified dementia without behavioral disturbance: Secondary | ICD-10-CM | POA: Diagnosis not present

## 2014-08-13 DIAGNOSIS — I359 Nonrheumatic aortic valve disorder, unspecified: Secondary | ICD-10-CM | POA: Diagnosis not present

## 2014-08-13 DIAGNOSIS — I10 Essential (primary) hypertension: Secondary | ICD-10-CM | POA: Diagnosis not present

## 2014-08-13 DIAGNOSIS — I639 Cerebral infarction, unspecified: Secondary | ICD-10-CM | POA: Diagnosis not present

## 2014-08-13 DIAGNOSIS — I739 Peripheral vascular disease, unspecified: Secondary | ICD-10-CM | POA: Diagnosis present

## 2014-08-13 DIAGNOSIS — Z955 Presence of coronary angioplasty implant and graft: Secondary | ICD-10-CM | POA: Diagnosis not present

## 2014-08-13 DIAGNOSIS — Z8673 Personal history of transient ischemic attack (TIA), and cerebral infarction without residual deficits: Secondary | ICD-10-CM | POA: Diagnosis not present

## 2014-08-13 DIAGNOSIS — I714 Abdominal aortic aneurysm, without rupture: Secondary | ICD-10-CM | POA: Diagnosis present

## 2014-08-13 DIAGNOSIS — Z8249 Family history of ischemic heart disease and other diseases of the circulatory system: Secondary | ICD-10-CM | POA: Diagnosis not present

## 2014-08-13 DIAGNOSIS — N179 Acute kidney failure, unspecified: Secondary | ICD-10-CM | POA: Diagnosis present

## 2014-08-13 DIAGNOSIS — G459 Transient cerebral ischemic attack, unspecified: Secondary | ICD-10-CM | POA: Diagnosis present

## 2014-08-13 DIAGNOSIS — Z87891 Personal history of nicotine dependence: Secondary | ICD-10-CM | POA: Diagnosis not present

## 2014-08-13 DIAGNOSIS — E785 Hyperlipidemia, unspecified: Secondary | ICD-10-CM | POA: Diagnosis not present

## 2014-08-13 MED ORDER — ENSURE COMPLETE PO LIQD
237.0000 mL | Freq: Two times a day (BID) | ORAL | Status: DC
  Administered 2014-08-13: 237 mL via ORAL

## 2014-08-13 MED ORDER — HALOPERIDOL LACTATE 5 MG/ML IJ SOLN
2.0000 mg | Freq: Once | INTRAMUSCULAR | Status: AC
  Administered 2014-08-13: 2 mg via INTRAVENOUS
  Filled 2014-08-13: qty 1

## 2014-08-13 NOTE — Progress Notes (Signed)
Physical Therapy Treatment Patient Details Name: [name] MRN: [id number] DOB: April 26, 1924 Today's Date: 08/13/2014    History of Present Illness Patient is a 81 y/o female with PMH of HTN, hyperlipidemia, TIA, dementia, AAA, CAD, aortic insufficiency, brought in to ED due to transient confusion and dysarthria. Family reports pt was back to baseline after arriving to ED.  MRI-subcentimeter focus of reduced diffusion within the RIGHT hypothalamus and LEFT periatrial white matter, equivocal for acute ischemia without large vessel infarct. CT- negative.    PT Comments    Patient progressing well with mobility. Improved ambulation distance today and tolerated stair negotiation. Pt high falls risk secondary to cognitive deficits and hx of dementia. Poor safety awareness and leaving RW at times w/out warning. Recommend 24/7 supervision for safety at home. Will continue to follow acutely.   Follow Up Recommendations  Supervision/Assistance - 24 hour     Equipment Recommendations  None recommended by PT    Recommendations for Other Services       Precautions / Restrictions Precautions Precautions: Fall Restrictions Weight Bearing Restrictions: No    Mobility  Bed Mobility               General bed mobility comments: Received sitting in recliner upon PT arrival.   Transfers Overall transfer level: Needs assistance Equipment used: Rolling walker (2 wheeled) Transfers: Sit to/from Stand Sit to Stand: Min guard         General transfer comment: Min guard to stand from chair.  Ambulation/Gait Ambulation/Gait assistance: Min assist Ambulation Distance (Feet): 500 Feet Assistive device: Rolling walker (2 wheeled) Gait Pattern/deviations: Step-through pattern;Decreased stride length;Staggering right;Narrow base of support Gait velocity: slow   General Gait Details: Slow, mildly unsteady gait walking towards the right side of the RW and veering right. 1 occasion of  bumping into walker leg on right. Left walker aside when getting into gym room, "I dont need this in here."   Stairs Stairs: Yes Stairs assistance: Min guard Stair Management: Two rails Number of Stairs: 3 (+ 2 steps) General stair comments: Min guard for safety and lines. Impulsive at times.   Wheelchair Mobility    Modified Rankin (Stroke Patients Only)       Balance Overall balance assessment: Needs assistance Sitting-balance support: Feet supported;No upper extremity supported Sitting balance-Leahy Scale: Fair     Standing balance support: During functional activity Standing balance-Leahy Scale: Fair                      Cognition Arousal/Alertness: Awake/alert Behavior During Therapy: WFL for tasks assessed/performed Overall Cognitive Status: No family/caregiver present to determine baseline cognitive functioning ("I dont even know why I am here?" "This new place, this hospital")       Memory: Decreased short-term memory              Exercises      General Comments        Pertinent Vitals/Pain Pain Assessment: No/denies pain    Home Living                      Prior Function            PT Goals (current goals can now be found in the care plan section) Progress towards PT goals: Progressing toward goals    Frequency  Min 3X/week    PT Plan Current plan remains appropriate    Co-evaluation  End of Session Equipment Utilized During Treatment: Gait belt Activity Tolerance: Patient tolerated treatment well Patient left: in chair;with call bell/phone within reach;with nursing/sitter in room     Time: 7253-6644 PT Time Calculation (min) (ACUTE ONLY): 16 min  Charges:  $Gait Training: 8-22 mins                    G CodesCandy Sledge A Aug 21, 2014, 12:07 PM  Candy Sledge, [place], DPT [phone]

## 2014-08-13 NOTE — Discharge Summary (Signed)
[name], 81 y.o., DOB 1924-05-29, MRN [id number]. Admission date: 08/11/2014 Discharge Date 08/13/2014 Primary MD Nyoka Cowden, MD Admitting Physician Shanda Howells, MD  Admission Diagnosis  TIA (transient ischemic attack) [G45.9] Altered mental status [R41.82] Encephalopathy [G93.40]  Discharge Diagnosis   Principal Problem:   TIA (transient ischemic attack) Active Problems:   Hyperlipidemia   Essential hypertension   Encephalopathy   AKI (acute kidney injury)   Cerebral infarction due to thrombosis of right middle cerebral artery   Dementia     PCP please follow-up: - Check CBC, BMP during next visit - We'll aim  for better blood pressure control in 5-7 days from acute CVA, please address as an outpatient.  Past Medical History  Diagnosis Date  . DJD (degenerative joint disease)   . CAD (coronary artery disease)   . Hypertension   . Hyperlipidemia   . Abdominal aortic aneurysm   . Osteopenia   . Aortic insufficiency   . Broken hip     left  . Stroke 2013  . Carotid artery occlusion     Past Surgical History  Procedure Laterality Date  . Cataract extraction    . Abdominal hysterectomy    . Cholecystectomy    . Tonsillectomy    . Ptca    . Appendectomy    . Cardiac catheterization  2000    PTCA,and stenting to RCA  . Hip fracture surgery      Left hip ORIF  . Fracture surgery Left   . Eye surgery       Hospital Course See H&P, Labs, Consult and Test reports for all details in brief, patient was admitted for **  Principal Problem:   TIA (transient ischemic attack) Active Problems:   Hyperlipidemia   Essential hypertension   Encephalopathy   AKI (acute kidney injury)   Cerebral infarction due to thrombosis of right middle cerebral artery   Dementia  [name] is an 81 y.o. female with a past medical history significant for HTN, hyperlipidemia, TIA, dementia, AAA, CAD, aortic insufficiency, brought in for further evaluation of  transient confusion and dysarthria as per her daughter , initial workup did show acute renal failure with a creatinine of 1.7 on admission, baseline creatinine is 0.9 -1.3 , glucose of 154, normal ammonia, unremarkable CBC, CT head did not show any acute findings, MRI was done did show evidence of acute right hypothalamus and subacute left periventricular white matter tiny infarcts secondary to small vessel disease , patient was seen by neurology service , with recommendation to continue her Plavix daily, continue with pravastatin, continue with antihypertensive medication , daughter at bedside reports her mother is back to baseline .  Acute CVA : - acute R hypothalamus and subacute L periventricular white matter tiny infarcts, secondary to small vessel disease source - No focal deficits, back to baseline . - MRA showing large vessel atherosclerosis - Carotid Doppler showing no significant stenosis - 2-D echo showing 65% ejection fraction with grade 1 diastolic dysfunction - Hemoglobin A1c is 5.4 - Continue with Plavix 75 mg oral daily - Home PT with home OT  Hypertension - Resume back on home medication on discharge - We'll aim  for better blood pressure control in 5-7 days from acute CVA, to follow with PCP regarding that.  Hyperlipidemia - LDL is 75, continue with pravastatin  Acute renal failure - Improving, is 1.45 at day of discharge - Creatinine baseline 0.9-1.3, creatinine is 1.7 on admission.  AAA - 4.9x4.8 cm  distal aortic aneurysm on u/s 04/2014   Consults   Neurology   Significant Tests:  See full reports for all details    Dg Chest 2 View  08/11/2014   CLINICAL DATA:  Patient with altered mental status.  EXAM: CHEST  2 VIEW  COMPARISON:  Chest radiograph 05/13/2011  FINDINGS: Stable cardiac and mediastinal contours with tortuosity and calcification of the thoracic aorta. Biapical pleural parenchymal thickening. Stable coarse interstitial pulmonary opacities. No large  consolidative pulmonary opacity. Mid thoracic spine degenerative change.  IMPRESSION: No acute cardiopulmonary process.   Electronically Signed   By: Lovey Newcomer M.D.   On: 08/11/2014 19:05   Ct Head Wo Contrast  08/11/2014   CLINICAL DATA:  Mental status change  EXAM: CT HEAD WITHOUT CONTRAST  TECHNIQUE: Contiguous axial images were obtained from the base of the skull through the vertex without intravenous contrast.  COMPARISON:  05/09/2011  FINDINGS: Global atrophy. Chronic ischemic changes in the periventricular white matter. No mass effect, midline shift, or acute hemorrhage. Mastoid air cells and visualized paranasal sinuses are clear. Cranium is intact.  IMPRESSION: No acute intracranial pathology.   Electronically Signed   By: Maryclare Bean M.D.   On: 08/11/2014 20:08   Mr Brain Wo Contrast (neuro Protocol)  08/11/2014   CLINICAL DATA:  Encephalopathic, acute onset confusion and dysarthria and weakness. Now at baseline. History of dementia, transient ischemic attack, coronary artery disease and aortic insufficiency.  EXAM: MRI HEAD WITHOUT CONTRAST  MRA HEAD WITHOUT CONTRAST  TECHNIQUE: Multiplanar, multiecho pulse sequences of the brain and surrounding structures were obtained without intravenous contrast. Angiographic images of the head were obtained using MRA technique without contrast.  COMPARISON:  None.  FINDINGS: MRI HEAD FINDINGS  Motion degraded examination, nearly nondiagnostic gradient sequence. Subcentimeter focus of reduced diffusion within the RIGHT hypo thalamus and punctate focus of reduced diffusion within the LEFT periatrial white matter. These are not confirmed on the motion degraded coronal sequence. Moderately motion degraded FLAIR sequence, severely motion degraded axial T1 sequence. Moderate to superior ventriculomegaly, likely on the basis of global parenchymal brain volume loss as there is overall commensurate enlargement of cerebral sulci and cerebellar folia. Remote LEFT  cerebellar infarct, present previously. Remote LEFT basal ganglia lacunar infarct. Patchy to confluent supratentorial white matter T2 hyperintensities. No midline shift or mass effect.  No abnormal extra-axial fluid collections. Status post bilateral ocular lens implants. The paranasal sinus air-fluid levels. The mastoid air cells appear well-aerated. No abnormal sellar expansion. No cerebellar tonsillar ectopia. Patient appears edentulous. No suspicious bone marrow signal.  MRA HEAD FINDINGS  Mildly motion degraded examination.  Anterior circulation: Normal flow related enhancement of the cervical, petrous, cavernous supra clinoid internal carotid arteries. However, there is a broad-based approximate 3 mm apparent outpouching laterally directed at the level of the LEFT cavernous carotid, axial 96/188. Dolicoectatic intracranial vessels. No flow related enhancement of these anterior and middle cerebral arteries.  Posterior circulation: The LEFT vertebral artery is dominant. Normal flow related into the vertebral arteries, basilar artery be branch vessels. Robust RIGHT posterior communicating with compensatory diminutive RIGHT P1 segment, a normal variant. Small LEFT posterior communicating artery. Flow related enhancement within the posterior cerebral arteries.  No large vessel occlusion, high-grade stenosis, suspicious luminal irregularity within the anterior or posterior circulation.  IMPRESSION: MRI HEAD: Motion degraded examination limits evaluation, subcentimeter focus of reduced diffusion within the RIGHT hypothalamus and LEFT periatrial white matter, equivocal for acute ischemia, without large vascular territory infarct.  Moderate to  severe global brain atrophy, moderate to severe white matter changes suggest chronic small vessel ischemic disease. Remote small LEFT cerebellar and LEFT basal ganglia infarcts.  MRA HEAD: Mildly motion degraded examination. No acute vascular process or high-grade stenosis.   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   Electronically Signed   By: Elon Alas   On: 08/11/2014 22:47   Mr Jodene Nam Head/brain Wo Cm  08/11/2014   CLINICAL DATA:  Encephalopathic, acute onset confusion and dysarthria and weakness. Now at baseline. History of dementia, transient ischemic attack, coronary artery disease and aortic insufficiency.  EXAM: MRI HEAD WITHOUT CONTRAST  MRA HEAD WITHOUT CONTRAST  TECHNIQUE: Multiplanar, multiecho pulse sequences of the brain and surrounding structures were obtained without intravenous contrast. Angiographic images of the head were obtained using MRA technique without contrast.  COMPARISON:  None.  FINDINGS: MRI HEAD FINDINGS  Motion degraded examination, nearly nondiagnostic gradient sequence. Subcentimeter focus of reduced diffusion within the RIGHT hypo thalamus and punctate focus of reduced diffusion within the LEFT periatrial white matter. These are not confirmed on the motion degraded coronal sequence. Moderately motion degraded FLAIR sequence, severely motion degraded axial T1 sequence. Moderate to superior ventriculomegaly, likely on the basis of global parenchymal brain volume loss as there is overall commensurate enlargement of cerebral sulci and cerebellar folia. Remote LEFT cerebellar infarct, present previously. Remote LEFT basal ganglia lacunar infarct. Patchy to confluent supratentorial white matter T2 hyperintensities. No midline shift or mass effect.  No abnormal extra-axial fluid collections. Status post bilateral ocular lens implants. The paranasal sinus air-fluid levels. The mastoid air cells appear well-aerated. No abnormal sellar expansion. No cerebellar tonsillar ectopia. Patient appears edentulous. No suspicious bone marrow signal.  MRA HEAD FINDINGS  Mildly motion degraded examination.  Anterior circulation: Normal flow related  enhancement of the cervical, petrous, cavernous supra clinoid internal carotid arteries. However, there is a broad-based approximate 3 mm apparent outpouching laterally directed at the level of the LEFT cavernous carotid, axial 96/188. Dolicoectatic intracranial vessels. No flow related enhancement of these anterior and middle cerebral arteries.  Posterior circulation: The LEFT vertebral artery is dominant. Normal flow related into the vertebral arteries, basilar artery be branch vessels. Robust RIGHT posterior communicating with compensatory diminutive RIGHT P1 segment, a normal variant. Small LEFT posterior communicating artery. Flow related enhancement within the posterior cerebral arteries.  No large vessel occlusion, high-grade stenosis, suspicious luminal irregularity within the anterior or posterior circulation.  IMPRESSION: MRI HEAD: Motion degraded examination limits evaluation, subcentimeter focus of reduced diffusion within the RIGHT hypothalamus and LEFT periatrial white matter, equivocal for acute ischemia, without large vascular territory infarct.  Moderate to severe global brain atrophy, moderate to severe white matter changes suggest chronic small vessel ischemic disease. Remote small LEFT cerebellar and LEFT basal ganglia infarcts.  MRA HEAD: Mildly motion degraded examination. No acute vascular process or high-grade stenosis.  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   Electronically Signed   By: Elon Alas   On: 08/11/2014 22:47     Today   Subjective:   Gaynell Face today has no headache,no chest abdominal pain,no new weakness tingling or numbness, feels much better today .  Objective:   Blood pressure 167/67, pulse 60, temperature 97.5 F (36.4 C), temperature source Oral, resp. rate 16, height 5\' 4"  (1.626 m), weight 51.4 kg (113  lb 5.1 oz), SpO2 97 %. No  intake or output data in the 24 hours ending 08/13/14 1525  Exam  General: NAD, alert, confused  HEENT: no scleral icterus  Cardiovascular: RRR without MRG  Respiratory: CTA biL, no wheezing  Abdomen: soft, non tender  MSK/Extremities: no clubbing  Skin: no rashes  Neuro: non focal, intermittently following commands   Data Review     CBC w Diff:  Lab Results  Component Value Date   WBC 5.5 08/11/2014   HGB 12.5 08/11/2014   HCT 37.0 08/11/2014   PLT 213 08/11/2014   LYMPHOPCT 12.5 02/26/2014   MONOPCT 10.6 02/26/2014   EOSPCT 11.1* 02/26/2014   BASOPCT 0.9 02/26/2014   CMP:  Lab Results  Component Value Date   NA 138 08/12/2014   K 4.0 08/12/2014   CL 106 08/12/2014   CO2 26 08/12/2014   BUN 14 08/12/2014   CREATININE 1.45* 08/12/2014   PROT 7.1 08/11/2014   ALBUMIN 3.8 08/11/2014   BILITOT 0.5 08/11/2014   ALKPHOS 74 08/11/2014   AST 21 08/11/2014   ALT 10 08/11/2014  .  Micro Results Recent Results (from the past 240 hour(s))  Blood culture (routine x 2)     Status: None (Preliminary result)   Collection Time: 08/11/14  7:24 PM  Result Value Ref Range Status   Specimen Description BLOOD RIGHT ARM  Final   Special Requests BOTTLES DRAWN AEROBIC AND ANAEROBIC 10CC  Final   Culture   Final           BLOOD CULTURE RECEIVED NO GROWTH TO DATE CULTURE WILL BE HELD FOR 5 DAYS BEFORE ISSUING A FINAL NEGATIVE REPORT Performed at [facility]    Report Status PENDING  Incomplete  Blood culture (routine x 2)     Status: None (Preliminary result)   Collection Time: 08/11/14  7:30 PM  Result Value Ref Range Status   Specimen Description BLOOD RIGHT HAND  Final   Special Requests BOTTLES DRAWN AEROBIC ONLY 10CC  Final   Culture   Final           BLOOD CULTURE RECEIVED NO GROWTH TO DATE CULTURE WILL BE HELD FOR 5 DAYS BEFORE ISSUING A FINAL NEGATIVE REPORT Performed at [facility]    Report Status PENDING  Incomplete     Discharge  Instructions      Follow-up Information    Follow up with Nyoka Cowden, MD. Schedule an appointment as soon as possible for a visit in 1 week.   Specialty:  Internal Medicine   Contact information:   [place] [address] [phone]       Discharge Medications     Medication List    TAKE these medications        amLODipine 10 MG tablet  Commonly known as:  NORVASC  TAKE ONE TABLET BY MOUTH ONCE DAILY     clobetasol cream 0.05 %  Commonly known as:  TEMOVATE  Apply 1 application topically as needed.     clopidogrel 75 MG tablet  Commonly known as:  PLAVIX  TAKE ONE TABLET BY MOUTH ONCE DAILY     hydrOXYzine 25 MG tablet  Commonly known as:  ATARAX/VISTARIL  Take 25 mg by mouth as needed.     metoprolol tartrate 25 MG tablet  Commonly known as:  LOPRESSOR  TAKE ONE TABLET BY MOUTH TWICE DAILY     nitroGLYCERIN 0.4 MG SL tablet  Commonly known as:  NITROSTAT  Place 1 tablet (0.4 mg total) under the tongue every 5 (five) minutes as needed.     pravastatin 40 MG tablet  Commonly known as:  PRAVACHOL  Take 1 tablet (40 mg total) by mouth daily.     PRESERVISION AREDS 2 PO  Take by mouth 2 (two) times daily.     triamcinolone ointment 0.1 %  Commonly known as:  KENALOG  Apply 1 application topically as needed.         Total Time in preparing paper work, data evaluation and todays exam - 35 minutes  ELGERGAWY, DAWOOD M.D on 08/13/2014 at 3:25 PM  [facility]  [phone]

## 2014-08-13 NOTE — Progress Notes (Signed)
*  PRELIMINARY RESULTS* Vascular Ultrasound Carotid Duplex (Doppler) has been completed.  Findings suggest 1-39% internal carotid artery stenosis bilaterally. Vertebral arteries are patent with antegrade flow.  08/13/2014 11:31 AM Maudry Mayhew, RVT, RDCS, RDMS

## 2014-08-13 NOTE — Progress Notes (Signed)
Patient declined blood draw this morning and stated "I do not want her to do it right now." I informed Mrs. Chow why we needed to draw blood from her this morning and that this was the only time that they came to draw but she still refused. Joaquin Bend E, [place]  08/13/2014 6:22 AM

## 2014-08-13 NOTE — Evaluation (Signed)
Occupational Therapy Evaluation Patient Details Name: [name] MRN: [id number] DOB: 12-02-1923 Today's Date: 08/13/2014    History of Present Illness Patient is a 81 y/o female with PMH of HTN, hyperlipidemia, TIA, dementia, AAA, CAD, aortic insufficiency, brought in to ED due to transient confusion and dysarthria. Family reports pt was back to baseline after arriving to ED.  MRI-subcentimeter focus of reduced diffusion within the RIGHT hypothalamus and LEFT periatrial white matter, equivocal for acute ischemia without large vessel infarct. CT- negative.   Clinical Impression   Pt admitted with above.  She presents to OT with impaired balance, visual deficits (has macular degeration) - difficult to determine if vision at baseline or a bit worse, and cognitive deficits.  Pt has h/o dementia, but daughter reports memory is worse and has not returned to baseline.  Currently, she requires min guard to min A with BADLs.  Daughter feels comfortable taking pt home and providing 24 hour assist.  Recommend HHOT (pt to discharge today)    Follow Up Recommendations  Home health OT;Supervision/Assistance - 24 hour    Equipment Recommendations  None recommended by OT    Recommendations for Other Services       Precautions / Restrictions Precautions Precautions: Fall Restrictions Weight Bearing Restrictions: No      Mobility Bed Mobility               General bed mobility comments: Received sitting in recliner upon PT arrival.   Transfers Overall transfer level: Needs assistance Equipment used: None Transfers: Sit to/from [facility] Sit to Stand: Min guard         General transfer comment: Min guard to stand from chair.    Balance Overall balance assessment: Needs assistance Sitting-balance support: Feet supported Sitting balance-Leahy Scale: Good     Standing balance support: During functional activity Standing balance-Leahy Scale: Fair                               ADL Overall ADL's : Needs assistance/impaired Eating/Feeding: Set up;Sitting   Grooming: Wash/dry hands;Wash/dry face;Oral care;Brushing hair;Minimal assistance;Standing   Upper Body Bathing: Supervision/ safety;Sitting   Lower Body Bathing: Sit to/from stand;Min guard   Upper Body Dressing : Supervision/safety;Sitting   Lower Body Dressing: Sit to/from stand;Min guard   Toilet Transfer: Minimal assistance;Ambulation;Comfort height toilet Toilet Transfer Details (indicate cue type and reason): Pt tripped over item in bathroom requiring min A to recover  Toileting- Clothing Manipulation and Hygiene: Min guard;Sit to/from stand       Functional mobility during ADLs: Minimal assistance;Min guard (min A without RW) General ADL Comments: Pt unable to apply toothpaste to toothbrush as she was unable to see the contrast.  She has h/o sever macular degeneration per daughter coupled with cognitive deficits makes determining significance of deficits difficult.  Pt would be best assessed in home environment where everything is familiar      Vision Eye Alignment: Within Functional Limits   Ocular Range of Motion: Within Functional Limits           Additional Comments: Pt loses items on Lt when performing pursuites.  Daughter reports pt has severe macular degeneration.  She was unable to participate in visual field testing due to cognitive deficits    Perception     Praxis      Pertinent Vitals/Pain Pain Assessment: No/denies pain     Hand Dominance Right   Extremity/Trunk Assessment Upper Extremity  Assessment Upper Extremity Assessment: Overall WFL for tasks assessed   Lower Extremity Assessment Lower Extremity Assessment: Defer to PT evaluation       Communication Communication Communication: HOH   Cognition Arousal/Alertness: Awake/alert Behavior During Therapy: WFL for tasks assessed/performed Overall Cognitive Status:  Impaired/Different from baseline Area of Impairment: Memory     Memory: Decreased short-term memory         General Comments: Pts daughter present and reports memory is not back to baseline.  Pt not oriented to time, but that is her baseline    General Comments       Exercises       Shoulder Instructions      Home Living Family/patient expects to be discharged to:: Private residence Living Arrangements: Children Available Help at Discharge: Family;Available PRN/intermittently Type of Home: House Home Access: Stairs to enter Entrance Stairs-Number of Steps: 6 vs 2 Entrance Stairs-Rails: Right Home Layout: One level     Bathroom Shower/Tub: Tub/shower unit;Curtain Shower/tub characteristics: Architectural technologist: Standard Bathroom Accessibility: Yes How Accessible: Accessible via walker Home Equipment: [place] - 2 wheels;Cane - single point;Bedside commode;Grab bars - toilet;Grab bars - tub/shower          Prior Functioning/Environment Level of Independence: Independent with assistive device(s)        Comments: Daughter present and reports she walks withou AD in the home, uses RW outside.  She performs all BADLs mod I and daughter performs IADLs.     OT Diagnosis: Generalized weakness;Cognitive deficits;Disturbance of vision   OT Problem List: Decreased strength;Impaired balance (sitting and/or standing);Impaired vision/perception;Decreased cognition;Decreased safety awareness   OT Treatment/Interventions:      OT Goals(Current goals can be found in the care plan section) Acute Rehab OT Goals Patient Stated Goal: to go home  OT Goal Formulation: All assessment and education complete, DC therapy  OT Frequency:     Barriers to D/C:            Co-evaluation              End of Session Nurse Communication: Mobility status  Activity Tolerance: Patient tolerated treatment well Patient left: in chair;with call bell/phone within reach;with chair  alarm set;with family/visitor present;with nursing/sitter in room   Time: 1503-1530 OT Time Calculation (min): 27 min Charges:  OT General Charges $OT Visit: 1 Procedure OT Evaluation $Initial OT Evaluation Tier I: 1 Procedure OT Treatments $Self Care/Home Management : 8-22 mins G-Codes:    Masayo Fera M 2014/08/15, 3:46 PM

## 2014-08-13 NOTE — Progress Notes (Signed)
Spoek with MD about [name] increasing combativeness. He was informed that we wrapped the IV and used mittens but both were unsuccessful.  Joaquin Bend E, RN 08/13/2014 1:03 AM

## 2014-08-13 NOTE — Progress Notes (Signed)
Pt is being discharged home with home health. Discharge insrtructions were given to patient and family

## 2014-08-13 NOTE — Progress Notes (Signed)
STROKE TEAM PROGRESS NOTE   HISTORY OF PRESENT ILLNESS [name] is an 81 y.o. female with a past medical history significant for HTN, hyperlipidemia, TIA, dementia, AAA, CAD, aortic insufficiency, brought in for further evaluation of transient confusion and dysarthria.  She reports that she is in the hospital " because I got confused for a while today". She said that she doesn't get confused and denies having HA, vertigo, double vision, focal weakness or numbness,slurred speech, language or vision difficulty. Family is not available and according to chart review " Daughter states at baseline patient is able to communicate well and knows her name and where she is. Daughter states half hour ago she started muttering words that did not make sense. Prior to that she was in her usual state of health had no recent illness. Daughter states patient normally can ambulate with use of a cane and is now very weak". She was LKW 08/11/2014 at 1700. Patient's symptoms resolved while still in the ED. She had a CT brain that showed no acute intracranial abnormality. Metabolic panel significant for slightly elevated creatinine 1.7, glucose 154, normal ammonia, and unremarkable CBC. Patient was not administered TPA secondary to symptoms resolved. She was admitted for further evaluation and treatment.   SUBJECTIVE (INTERVAL HISTORY) Sitter at bedside. No family present. Pt condition stable and back to her baseline. No complains. Walking well with PT in the hallway.   OBJECTIVE Temp:  [97.6 F (36.4 C)-98.2 F (36.8 C)] 97.6 F (36.4 C) (01/19 2102) Pulse Rate:  [61-69] 67 (01/20 0550) Cardiac Rhythm:  [-] Heart block (01/20 0800) Resp:  [16-18] 16 (01/20 0550) BP: (133-146)/(48-57) 142/53 mmHg (01/20 0550) SpO2:  [94 %-99 %] 97 % (01/20 0550)   Recent Labs Lab 08/11/14 1833  GLUCAP 154*    Recent Labs Lab 08/11/14 1845 08/12/14 1243  NA 136 138  K 4.5 4.0  CL 104 106  CO2 23 26  GLUCOSE 154*  114*  BUN 18 14  CREATININE 1.71* 1.45*  CALCIUM 8.9 9.0    Recent Labs Lab 08/11/14 1845  AST 21  ALT 10  ALKPHOS 74  BILITOT 0.5  PROT 7.1  ALBUMIN 3.8    Recent Labs Lab 08/11/14 1845  WBC 5.5  HGB 12.5  HCT 37.0  MCV 88.5  PLT 213   No results for input(s): CKTOTAL, CKMB, CKMBINDEX, TROPONINI in the last 168 hours.  Recent Labs  08/11/14 1845  LABPROT 13.3  INR 1.00    Recent Labs  08/11/14 1953  COLORURINE YELLOW  LABSPEC 1.017  PHURINE 7.5  GLUCOSEU NEGATIVE  HGBUR NEGATIVE  BILIRUBINUR NEGATIVE  KETONESUR NEGATIVE  PROTEINUR NEGATIVE  UROBILINOGEN 1.0  NITRITE NEGATIVE  LEUKOCYTESUR NEGATIVE       Component Value Date/Time   CHOL 141 08/12/2014 0541   TRIG 76 08/12/2014 0541   HDL 51 08/12/2014 0541   CHOLHDL 2.8 08/12/2014 0541   VLDL 15 08/12/2014 0541   LDLCALC 75 08/12/2014 0541   Lab Results  Component Value Date   HGBA1C 5.4 08/12/2014   No results found for: LABOPIA, COCAINSCRNUR, LABBENZ, AMPHETMU, THCU, LABBARB  No results for input(s): ETH in the last 168 hours.    Ct Head Wo Contrast 08/11/2014 No acute intracranial pathology.   Mr Brain Wo Contrast (neuro Protocol) 08/11/2014 Motion degraded examination limits evaluation, subcentimeter focus of reduced diffusion within the RIGHT hypothalamus and LEFT periatrial white matter, equivocal for acute ischemia, without large vascular territory infarct. Moderate to severe global  brain atrophy, moderate to severe white matter changes suggest chronic small vessel ischemic disease. Remote small LEFT cerebellar and LEFT basal ganglia infarcts.   Mr Jodene Nam Head/brain Wo Cm 08/11/2014 Mildly motion degraded examination. No acute vascular process or high-grade stenosis. 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   Dg Chest 2 View 08/11/2014 No acute cardiopulmonary  process.   Carotid Doppler  Bilateral: 1-39% ICA stenosis. Vertebral artery flow is antegrade.  2D Echocardiogram  pending  EEG - This is an abnormal EEG secondary to posterior background slowing. This finding may be seen with a diffuse gray matter disturbance that is etiologically nonspecific, but may include a dementia, among other possibilities. No epileptiform discharges or electrographic seizures noted. The absence does not rule out a clinical diagnosis of epilepsy.   PHYSICAL EXAM  Temp:  [97.6 F (36.4 C)-98.2 F (36.8 C)] 97.6 F (36.4 C) (01/19 2102) Pulse Rate:  [61-69] 67 (01/20 0550) Resp:  [16-18] 16 (01/20 0550) BP: (133-146)/(48-57) 142/53 mmHg (01/20 0550) SpO2:  [94 %-99 %] 97 % (01/20 0550)  General - Well nourished, well developed, in no apparent distress.  Ophthalmologic - not cooperative on exam and kept asking me to leave her alone  Cardiovascular - Regular rate and rhythm with no murmur.  Neck - supple, no carotid bruits  Mental Status -  Awake alert but not orientated to place and time or president. Orientated to self. Language including expression, naming, repetition, comprehension was assessed and found intact, although slow in response.  Cranial Nerves II - XII - II - Visual field intact OU. III, IV, VI - Extraocular movements intact. V - Facial sensation intact bilaterally. VII - Facial movement intact bilaterally. VIII - Hard of hearing & vestibular intact bilaterally. X - Palate elevates symmetrically. XI - Chin turning & shoulder shrug intact bilaterally. XII - Tongue protrusion intact.  Motor Strength - The patient's strength was normal in all extremities and pronator drift was absent.  Bulk was normal and fasciculations were absent.   Motor Tone - Muscle tone was assessed at the neck and appendages and was normal.  Reflexes - The patient's reflexes were 1+ in all extremities and she had no pathological reflexes.  Sensory - Light touch,  temperature/pinprick were assessed and were normal.    Coordination - The patient had normal movements in the hands and feet with no ataxia or dysmetria.  Tremor was absent.  Gait and Station - slow cautious gait but able to walk without assistance.   ASSESSMENT/PLAN [name] is a 81 y.o. female with history of HTN, hyperlipidemia, TIA, dementia, AAA, CAD, aortic insufficiency presenting with transient confusion and dysarthria. She did not receive IV t-PA due to resolution of symptoms.   Stroke: acute R hypothalamus and subacute L periventricular white matter tiny infarcts, secondary to small vessel disease source  Resultant back to baseline  MRI see above  MRA large vessel atherosclerosis  Carotid Doppler unremarkable  2D Echo pending  HgbA1c 5.4 at the goal  lovenox subq for VTE prophylaxis  Diet Heart thin liquids  clopidogrel 75 mg orally every day prior to admission, now on clopidogrel 75 mg orally every day  Ongoing aggressive stroke risk factor management  Therapy recommendations: HH PT  Disposition: pending  Hypertension  Home meds:amlodipine  Stable  Permissive hypertension (OK if <220/120) for 24-48 hours post stroke and then gradually normalized within 5-7 days.  Hyperlipidemia  Home meds: pravastatin resumed in hospital  LDL 75, goal < 70  continue statin at discharge  Other Stroke Risk Factors  Advanced age  Former Cigarette smoker, quit 3 yrs ago  Hx stroke/TIA   CAD  Aortic insufficiency  Other Active Problems  AKI   AAA  Baseline dementia  Hospital day # [place] for Pager information 08/13/2014 9:39 AM   I, the attending vascular neurologist, have personally obtained a history, examined the patient, evaluated laboratory data, individually viewed imaging studies and agree with radiology interpretations. Together with the NP/PA, we formulated the assessment and  plan of care which reflects our mutual decision.  I have made any additions or clarifications directly to the above note and agree with the findings and plan as currently documented.   81 yo female with multiple stroke risk factors and dementia presented with transient confusion and dysarthria. MRI showed acute R hypothalamus and subacute L periventricular white matter tiny infarcts, secondary to small vessel disease. Symptoms resolved now. Continue plavix and statin for stroke prevention. Control risk factors. Disposition as per PT/OT.  Neurology will sign off. Please call with questions. Thanks for the consult.  Rosalin Hawking, MD PhD Stroke Neurology 08/13/2014 2:16 PM       To contact Stroke Continuity provider, please refer to [email or web address] After hours, contact General Neurology

## 2014-08-13 NOTE — Clinical Documentation Improvement (Signed)
"  AKI" documented in current medical record.  Please document clinical indicators for the diagnosis of AKI.    (renal definition of AKI requires a change in serum Creat of greater than or equal to 0.3 mg/dl within 48 hours or as compared to last known serum Creat prior to admission.)    Thank You, Erling Conte ,RN Clinical Documentation Specialist:  [facility]

## 2014-08-13 NOTE — Discharge Instructions (Signed)
Follow with Primary MD Nyoka Cowden, MD in 7 days   Get CBC, CMP, 2 view Chest X ray checked  by Primary MD next visit.    Activity: As tolerated with Full fall precautions use walker/cane & assistance as needed   Disposition Home    Diet: Heart Healthy  , with feeding assistance and aspiration precautions as needed.  For Heart failure patients - Check your Weight same time everyday, if you gain over 2 pounds, or you develop in leg swelling, experience more shortness of breath or chest pain, call your Primary MD immediately. Follow Cardiac Low Salt Diet and 1.8 lit/day fluid restriction.   On your next visit with your primary care physician please Get Medicines reviewed and adjusted.   Please request your Prim.MD to go over all Hospital Tests and Procedure/Radiological results at the follow up, please get all Hospital records sent to your Prim MD by signing hospital release before you go home.   If you experience worsening of your admission symptoms, develop shortness of breath, life threatening emergency, suicidal or homicidal thoughts you must seek medical attention immediately by calling 911 or calling your MD immediately  if symptoms less severe.  You Must read complete instructions/literature along with all the possible adverse reactions/side effects for all the Medicines you take and that have been prescribed to you. Take any new Medicines after you have completely understood and accpet all the possible adverse reactions/side effects.   Do not drive, operating heavy machinery, perform activities at heights, swimming or participation in water activities or provide baby sitting services if your were admitted for syncope or siezures until you have seen by Primary MD or a Neurologist and advised to do so again.  Do not drive when taking Pain medications.    Do not take more than prescribed Pain, Sleep and Anxiety Medications  Special Instructions: If you have smoked or  chewed Tobacco  in the last 2 yrs please stop smoking, stop any regular Alcohol  and or any Recreational drug use.  Wear Seat belts while driving.   Please note  You were cared for by a hospitalist during your hospital stay. If you have any questions about your discharge medications or the care you received while you were in the hospital after you are discharged, you can call the unit and asked to speak with the hospitalist on call if the hospitalist that took care of you is not available. Once you are discharged, your primary care physician will handle any further medical issues. Please note that NO REFILLS for any discharge medications will be authorized once you are discharged, as it is imperative that you return to your primary care physician (or establish a relationship with a primary care physician if you do not have one) for your aftercare needs so that they can reassess your need for medications and monitor your lab values.

## 2014-08-13 NOTE — Progress Notes (Signed)
UR completed 

## 2014-08-13 NOTE — Care Management Note (Signed)
Page 1 of 1   08/13/2014     3:52:51 PM CARE MANAGEMENT NOTE 08/13/2014  Patient:  [name], [name]   Account Number:  [id number]  Date Initiated:  08/13/2014  Documentation initiated by:  Lorne Skeens  Subjective/Objective Assessment:   Patient was admitted with altered mental status, acute CVA. Lives at home with daughter, who provides 24 hr care.     Action/Plan:   Will follow for discharge needs.   Anticipated DC Date:  08/13/2014   Anticipated DC Plan:  [place]  CM consult      Choice offered to / List presented to:  C-4 Adult Children        HH arranged  HH-1 RN  [place]      [place].   Status of service:  Completed, signed off Medicare Important Message given?  NA - LOS <3 / Initial given by admissions (If response is "NO", the following Medicare IM given date fields will be blank) Date Medicare IM given:   Medicare IM given by:   Date Additional Medicare IM given:   Additional Medicare IM given by:    Discharge Disposition:  [place]  Per UR Regulation:    If discussed at Long Length of Stay Meetings, dates discussed:    Comments:  08/13/14 [place] RN- Met with patient and daughter to discuss home health needs. Daughter has chosen [facility], which they have used in the past.  Miranda with [facility] was notified and has accepted the referral for discharge home today.

## 2014-08-13 NOTE — Progress Notes (Signed)
INITIAL NUTRITION ASSESSMENT  DOCUMENTATION CODES Per approved criteria  -Not Applicable   INTERVENTION: Encourage adequate healthful PO intake Assist pt in ordering meals Recommend liberalizing diet to Regular Provide Multivitamin with minerals daily  NUTRITION DIAGNOSIS: Inadequate oral intake related to limited food preferences as evidenced by 30% meal completion.   Goal: Pt to meet >/= 90% of their estimated nutrition needs   Monitor:  PO intake, weight trend, labs  Reason for Assessment: Malnutrition Screening Tool  81 y.o. female  Admitting Dx: TIA (transient ischemic attack)  ASSESSMENT: 81 y.o. female with a past medical history significant for HTN, hyperlipidemia, TIA, dementia, AAA, CAD, aortic insufficiency, brought in for further evaluation of transient confusion and dysarthria.  Pt states that she had a good appetite and was eating well PTA, maintaining her weight around 116 lbs. She states that her appetite is still good but, she has not been eating well since admission. Per nursing notes pt ate 30% of lunch yesterday; pt reports eating a few bites of breakfast this morning and 75% of dinner last night. Pt states she has not ordered her meals, they are just being sent. RD provided pt with a food service menu and assisted patient in ordering today's meals with foods pt likes. Pt declines nutritional supplements and snacks at this time. RD encouraged adequate healthful PO intake.   Labs: low GFR  Height: Ht Readings from Last 1 Encounters:  08/11/14 5\' 4"  (1.626 m)    Weight: Wt Readings from Last 1 Encounters:  08/11/14 113 lb 5.1 oz (51.4 kg)    Ideal Body Weight: 120 lbs  % Ideal Body Weight: 94%  Wt Readings from Last 10 Encounters:  08/11/14 113 lb 5.1 oz (51.4 kg)  04/25/14 116 lb (52.617 kg)  02/26/14 117 lb (53.071 kg)  12/27/13 117 lb 9.6 oz (53.343 kg)  10/18/13 115 lb (52.164 kg)  08/19/13 119 lb (53.978 kg)  12/26/12 114 lb 12.8 oz  (52.073 kg)  06/11/12 116 lb (52.617 kg)  06/04/12 115 lb (52.164 kg)  03/27/12 109 lb 11.2 oz (49.76 kg)    Usual Body Weight: 116 lbs  % Usual Body Weight: 97%  BMI:  Body mass index is 19.44 kg/(m^2).  Estimated Nutritional Needs: Kcal: 1250-1400 Protein: 60-70 grams Fluid: 1.4 L/day  Skin: intact  Diet Order: Diet Heart  EDUCATION NEEDS: -No education needs identified at this time  No intake or output data in the 24 hours ending 08/13/14 1315  Last BM: PTA   Labs:   Recent Labs Lab 08/11/14 1845 08/12/14 1243  NA 136 138  K 4.5 4.0  CL 104 106  CO2 23 26  BUN 18 14  CREATININE 1.71* 1.45*  CALCIUM 8.9 9.0  GLUCOSE 154* 114*    CBG (last 3)   Recent Labs  08/11/14 1833  GLUCAP 154*    Scheduled Meds: . clopidogrel  75 mg Oral Daily  . enoxaparin (LOVENOX) injection  30 mg Subcutaneous Q24H  . feeding supplement (ENSURE COMPLETE)  237 mL Oral BID BM  . metoprolol tartrate  25 mg Oral BID  . multivitamin with minerals  1 tablet Oral BID  . pravastatin  40 mg Oral Daily    Continuous Infusions: . sodium chloride 100 mL/hr at 08/13/14 1010    Past Medical History  Diagnosis Date  . DJD (degenerative joint disease)   . CAD (coronary artery disease)   . Hypertension   . Hyperlipidemia   . Abdominal aortic aneurysm   .  Osteopenia   . Aortic insufficiency   . Broken hip     left  . Stroke 2013  . Carotid artery occlusion     Past Surgical History  Procedure Laterality Date  . Cataract extraction    . Abdominal hysterectomy    . Cholecystectomy    . Tonsillectomy    . Ptca    . Appendectomy    . Cardiac catheterization  2000    PTCA,and stenting to RCA  . Hip fracture surgery      Left hip ORIF  . Fracture surgery Left   . Eye surgery      Pryor Ochoa RD, LDN Inpatient Clinical Dietitian Pager: [phone] After Hours Pager: [phone]

## 2014-08-13 NOTE — Progress Notes (Signed)
  Echocardiogram 2D Echocardiogram has been performed.  [name] 08/13/2014, 9:03 AM

## 2014-08-18 LAB — CULTURE, BLOOD (ROUTINE X 2)
CULTURE: NO GROWTH
Culture: NO GROWTH

## 2014-08-21 ENCOUNTER — Encounter: Payer: Self-pay | Admitting: Internal Medicine

## 2014-08-21 ENCOUNTER — Ambulatory Visit (INDEPENDENT_AMBULATORY_CARE_PROVIDER_SITE_OTHER): Payer: Medicare Other | Admitting: Internal Medicine

## 2014-08-21 VITALS — BP 118/60 | HR 58 | Temp 97.9°F | Resp 18 | Ht 64.0 in | Wt 118.0 lb

## 2014-08-21 DIAGNOSIS — I10 Essential (primary) hypertension: Secondary | ICD-10-CM

## 2014-08-21 DIAGNOSIS — I63311 Cerebral infarction due to thrombosis of right middle cerebral artery: Secondary | ICD-10-CM | POA: Diagnosis not present

## 2014-08-21 DIAGNOSIS — R634 Abnormal weight loss: Secondary | ICD-10-CM

## 2014-08-21 DIAGNOSIS — G9340 Encephalopathy, unspecified: Secondary | ICD-10-CM | POA: Diagnosis not present

## 2014-08-21 LAB — BASIC METABOLIC PANEL
BUN: 24 mg/dL — ABNORMAL HIGH (ref 6–23)
CO2: 26 meq/L (ref 19–32)
Calcium: 9.8 mg/dL (ref 8.4–10.5)
Chloride: 104 mEq/L (ref 96–112)
Creatinine, Ser: 1.5 mg/dL — ABNORMAL HIGH (ref 0.40–1.20)
GFR: 34.78 mL/min — ABNORMAL LOW (ref >60.00)
Glucose, Bld: 100 mg/dL — ABNORMAL HIGH (ref 70–99)
Potassium: 4.7 mEq/L (ref 3.5–5.1)
Sodium: 137 mEq/L (ref 135–145)

## 2014-08-21 NOTE — Progress Notes (Signed)
Pre visit review using our clinic review tool, if applicable. No additional management support is needed unless otherwise documented below in the visit note. 

## 2014-08-21 NOTE — Progress Notes (Signed)
   Subjective:    Patient ID: [name], female    DOB: 01-31-1924, 81 y.o.   MRN: [id number]  HPI    Review of Systems     Objective:   Physical Exam        Assessment & Plan:

## 2014-08-21 NOTE — Progress Notes (Signed)
Subjective:    Patient ID: [name], female    DOB: Dec 03, 1923, 81 y.o.   MRN: [id number]  HPI Admission date: 08/11/2014 Discharge Date 08/13/2014 Primary MD Nyoka Cowden, MD Admitting Physician Shanda Howells, MD  Admission Diagnosis TIA (transient ischemic attack) [G45.9] Altered mental status [R41.82] Encephalopathy [G93.40]  Discharge Diagnosis Principal Problem:  TIA (transient ischemic attack) Active Problems:  Hyperlipidemia  Essential hypertension  Encephalopathy  AKI (acute kidney injury)  Cerebral infarction due to thrombosis of right middle cerebral artery  Dementia   PCP please follow-up: - Check CBC, BMP during next visit - We'll aim for better blood pressure control in 5-7 days from acute CVA, please address as an outpatient.  81 year old patient who is seen following a recent hospital discharge.  Since her discharge she has been quite stable and is back to baseline.  She is seen today accompanied by her daughter  Hospital records reviewed  Past Medical History  Diagnosis Date  . DJD (degenerative joint disease)   . CAD (coronary artery disease)   . Hypertension   . Hyperlipidemia   . Abdominal aortic aneurysm   . Osteopenia   . Aortic insufficiency   . Broken hip     left  . Stroke 2013  . Carotid artery occlusion     History   Social History  . Marital Status: Widowed    Spouse Name: N/A    Number of Children: N/A  . Years of Education: N/A   Occupational History  . Not on file.   Social History Main Topics  . Smoking status: Former Smoker -- 50 years    Types: Cigarettes    Quit date: 01/09/2011  . Smokeless tobacco: Never Used  . Alcohol Use: No  . Drug Use: No  . Sexual Activity: Not on file   Other Topics Concern  . Not on file   Social History Narrative    Past Surgical History  Procedure Laterality Date  . Cataract extraction    . Abdominal hysterectomy    . Cholecystectomy    .  Tonsillectomy    . Ptca    . Appendectomy    . Cardiac catheterization  2000    PTCA,and stenting to RCA  . Hip fracture surgery      Left hip ORIF  . Fracture surgery Left   . Eye surgery      Family History  Problem Relation Age of Onset  . Coronary artery disease Mother   . Heart disease Mother   . Dementia Sister   . Heart disease Sister   . Coronary artery disease Brother   . Cancer Sister     Cervical or Ovarian    Allergies  Allergen Reactions  . Alendronate Sodium   . Chlordiazepoxide   . Librium   . Zocor [Simvastatin]     Leg cramps    Current Outpatient Prescriptions on File Prior to Visit  Medication Sig Dispense Refill  . amLODipine (NORVASC) 10 MG tablet TAKE ONE TABLET BY MOUTH ONCE DAILY 30 tablet 6  . clobetasol cream (TEMOVATE) 7.85 % Apply 1 application topically as needed.     . clopidogrel (PLAVIX) 75 MG tablet TAKE ONE TABLET BY MOUTH ONCE DAILY 30 tablet 6  . hydrOXYzine (ATARAX/VISTARIL) 25 MG tablet Take 25 mg by mouth as needed.     . metoprolol tartrate (LOPRESSOR) 25 MG tablet TAKE ONE TABLET BY MOUTH TWICE DAILY 180 tablet 0  . Multiple Vitamins-Minerals (PRESERVISION AREDS 2  PO) Take by mouth 2 (two) times daily.    . nitroGLYCERIN (NITROSTAT) 0.4 MG SL tablet Place 1 tablet (0.4 mg total) under the tongue every 5 (five) minutes as needed. 25 tablet 11  . pravastatin (PRAVACHOL) 40 MG tablet Take 1 tablet (40 mg total) by mouth daily. 30 tablet 6  . triamcinolone ointment (KENALOG) 0.1 % Apply 1 application topically as needed.      No current facility-administered medications on file prior to visit.    BP 118/60 mmHg  Pulse 58  Temp(Src) 97.9 F (36.6 C) (Oral)  Resp 18  Ht 5\' 4"  (1.626 m)  Wt 118 lb (53.524 kg)  BMI 20.24 kg/m2  SpO2 95%    Review of Systems  Constitutional: Negative.   HENT: Negative for congestion, dental problem, hearing loss, rhinorrhea, sinus pressure, sore throat and tinnitus.   Eyes: Negative for  pain, discharge and visual disturbance.  Respiratory: Negative for cough and shortness of breath.   Cardiovascular: Negative for chest pain, palpitations and leg swelling.  Gastrointestinal: Negative for nausea, vomiting, abdominal pain, diarrhea, constipation, blood in stool and abdominal distention.  Genitourinary: Negative for dysuria, urgency, frequency, hematuria, flank pain, vaginal bleeding, vaginal discharge, difficulty urinating, vaginal pain and pelvic pain.  Musculoskeletal: Negative for joint swelling, arthralgias and gait problem.  Skin: Negative for rash.  Neurological: Positive for speech difficulty. Negative for dizziness, syncope, weakness, numbness and headaches.  Hematological: Negative for adenopathy.  Psychiatric/Behavioral: Positive for confusion. Negative for behavioral problems, dysphoric mood and agitation. The patient is not nervous/anxious.        Objective:   Physical Exam  Constitutional: She is oriented to person, place, and time. She appears well-developed and well-nourished.  HENT:  Head: Normocephalic.  Right Ear: External ear normal.  Left Ear: External ear normal.  Mouth/Throat: Oropharynx is clear and moist.  Eyes: Conjunctivae and EOM are normal. Pupils are equal, round, and reactive to light.  Neck: Normal range of motion. Neck supple. No thyromegaly present.  Cardiovascular: Normal rate, regular rhythm, normal heart sounds and intact distal pulses.   Pulmonary/Chest: Effort normal and breath sounds normal.  Abdominal: Soft. Bowel sounds are normal. She exhibits no mass. There is no tenderness.  Musculoskeletal: Normal range of motion.  Lymphadenopathy:    She has no cervical adenopathy.  Neurological: She is alert and oriented to person, place, and time.  Normal speech No drift Finger to nose testing performed slightly better on the right  Skin: Skin is warm and dry. No rash noted.  Psychiatric: She has a normal mood and affect. Her behavior is  normal.          Assessment & Plan:   Status post acute confusional state secondary to CVA Dysphagia, resolved Hypertension, well-controlled on present regimen Dyslipidemia.  Continue statin therapy  Recheck 3 months We'll check follow-up electrolytes

## 2014-08-21 NOTE — Patient Instructions (Signed)
Limit your sodium (Salt) intake  Return in 3 months for follow-up   

## 2014-08-25 ENCOUNTER — Telehealth: Payer: Self-pay | Admitting: Internal Medicine

## 2014-08-25 NOTE — Telephone Encounter (Signed)
Tried to call Santiago Glad back at [facility] they were unable to help me due to they did not know who Santiago Glad was.

## 2014-08-25 NOTE — Telephone Encounter (Signed)
Santiago Glad would like to verfy dr Raliegh Ip is the attending phsician

## 2014-08-29 NOTE — Telephone Encounter (Signed)
[name] called back from [facility] and said sorry that no one could help me last week when I called and said she will make sure the person at the desk knows who she is. Told her that is okay and Dr.K is pt's attending physician. Santiago Glad verbalized understanding.

## 2014-09-03 ENCOUNTER — Other Ambulatory Visit: Payer: Self-pay | Admitting: Cardiovascular Disease

## 2014-10-18 ENCOUNTER — Other Ambulatory Visit: Payer: Self-pay | Admitting: Cardiovascular Disease

## 2014-10-21 ENCOUNTER — Ambulatory Visit (INDEPENDENT_AMBULATORY_CARE_PROVIDER_SITE_OTHER): Payer: Medicare Other | Admitting: Cardiovascular Disease

## 2014-10-21 ENCOUNTER — Encounter: Payer: Self-pay | Admitting: Cardiovascular Disease

## 2014-10-21 VITALS — BP 143/62 | HR 58 | Ht 64.0 in | Wt 114.4 lb

## 2014-10-21 DIAGNOSIS — I714 Abdominal aortic aneurysm, without rupture, unspecified: Secondary | ICD-10-CM

## 2014-10-21 DIAGNOSIS — I2510 Atherosclerotic heart disease of native coronary artery without angina pectoris: Secondary | ICD-10-CM | POA: Diagnosis not present

## 2014-10-21 DIAGNOSIS — I63311 Cerebral infarction due to thrombosis of right middle cerebral artery: Secondary | ICD-10-CM

## 2014-10-21 MED ORDER — CLOPIDOGREL BISULFATE 75 MG PO TABS: 75.0000 mg | ORAL_TABLET | Freq: Every day | ORAL | Status: DC

## 2014-10-21 NOTE — Progress Notes (Signed)
Cardiology Office Note   Date:  10/21/2014   ID:  [name], DOB 1924/04/05, MRN [id number]  PCP:  Nyoka Cowden, MD  Cardiologist:   Thayer Headings, MD   Chief Complaint  Patient presents with  . Coronary Artery Disease   1. CAD 2. Abdominal Aortic aneurism 3. Hyperlipidemia 4.  Carotid artery disease  History of Present Illness:  She has no complaints. She did not take her meds this am. She stopped smoking several months ago. As a result she's gaining a little bit of weight. She has seen Dr. Bridgett Larsson for further evaluation of her abdominal aortic aneurysm. She has been getting follow up exams. She has not needed surgery for aneurysm yet.  December 26, 2012:  [name] is doing well. No CP. She has an AAA that is being observed.   December 27, 2013:  [name] is doing ok from a cardiac standpoint. She is losing her eyesight. No , no dyspnea. Her AAA is slowly enlarging. Followed by Dr. Bridgett Larsson.   October 21, 2014:  [name] is a 81 y.o. female who presents for follow-up of her coronary artery disease.  She had a stroke several months ago.   No CP or dyspnea.    Past Medical History  Diagnosis Date  . DJD (degenerative joint disease)   . CAD (coronary artery disease)   . Hypertension   . Hyperlipidemia   . Abdominal aortic aneurysm   . Osteopenia   . Aortic insufficiency   . Broken hip     left  . Stroke 2013  . Carotid artery occlusion     Past Surgical History  Procedure Laterality Date  . Cataract extraction    . Abdominal hysterectomy    . Cholecystectomy    . Tonsillectomy    . Ptca    . Appendectomy    . Cardiac catheterization  2000    PTCA,and stenting to RCA  . Hip fracture surgery      Left hip ORIF  . Fracture surgery Left   . Eye surgery       Current Outpatient Prescriptions  Medication Sig Dispense Refill  . amLODipine (NORVASC) 10 MG tablet TAKE ONE TABLET BY MOUTH ONCE DAILY 30 tablet 6  . clobetasol cream  (TEMOVATE) 0.35 % Apply 1 application topically as needed.     . clopidogrel (PLAVIX) 75 MG tablet TAKE ONE TABLET BY MOUTH ONCE DAILY 30 tablet 6  . hydrOXYzine (ATARAX/VISTARIL) 25 MG tablet Take 25 mg by mouth as needed.     . metoprolol tartrate (LOPRESSOR) 25 MG tablet TAKE ONE TABLET BY MOUTH TWICE DAILY 180 tablet 0  . Multiple Vitamins-Minerals (PRESERVISION AREDS 2 PO) Take by mouth 2 (two) times daily.    . nitroGLYCERIN (NITROSTAT) 0.4 MG SL tablet Place 1 tablet (0.4 mg total) under the tongue every 5 (five) minutes as needed. 25 tablet 11  . pravastatin (PRAVACHOL) 40 MG tablet Take 1 tablet (40 mg total) by mouth daily. 30 tablet 6  . triamcinolone ointment (KENALOG) 0.1 % Apply 1 application topically as needed.      No current facility-administered medications for this visit.    Allergies:   Alendronate sodium; Chlordiazepoxide; Librium; and Zocor    Social History:  The patient  reports that she quit smoking about 3 years ago. Her smoking use included Cigarettes. She quit after 50 years of use. She has never used smokeless tobacco. She reports that she does not drink alcohol or use  illicit drugs.   Family History:  The patient's family history includes Cancer in her sister; Coronary artery disease in her brother and mother; Dementia in her sister; Heart disease in her mother and sister.    ROS:  Please see the history of present illness.    Review of Systems: Constitutional:  denies fever, chills, diaphoresis, appetite change and fatigue.  HEENT: denies photophobia, eye pain, redness, hearing loss, ear pain, congestion, sore throat, rhinorrhea, sneezing, neck pain, neck stiffness and tinnitus.  Respiratory: denies SOB, DOE, cough, chest tightness, and wheezing.  Cardiovascular: denies chest pain, palpitations and leg swelling.  Gastrointestinal: denies nausea, vomiting, abdominal pain, diarrhea, constipation, blood in stool.  Genitourinary: denies dysuria, urgency,  frequency, hematuria, flank pain and difficulty urinating.  Musculoskeletal: denies  myalgias, back pain, joint swelling, arthralgias and gait problem.   Skin: denies pallor, rash and wound.  Neurological: denies dizziness, seizures, syncope, weakness, light-headedness, numbness and headaches.   Hematological: denies adenopathy, easy bruising, personal or family bleeding history.  Psychiatric/ Behavioral: denies suicidal ideation, mood changes, confusion, nervousness, sleep disturbance and agitation.       All other systems are reviewed and negative.    PHYSICAL EXAM: VS:  There were no vitals taken for this visit. , BMI There is no weight on file to calculate BMI. GEN: Well nourished, well developed, in no acute distress HEENT: normal Neck: no JVD, carotid bruits, or masses Cardiac: RRR; no murmurs, rubs,  No edema  Respiratory:  clear to auscultation bilaterally, normal work of breathing GI: soft, , pulsitile midline mass  MS: no deformity or atrophy Skin: warm and dry, no rash Neuro:  Strength and sensation are intact Psych: normal   EKG:  EKG is not ordered today.    Recent Labs: 02/26/2014: TSH 2.66 08/11/2014: ALT 10; Hemoglobin 12.5; Platelets 213 08/21/2014: BUN 24*; Creatinine 1.50*; Potassium 4.7; Sodium 137    Lipid Panel    Component Value Date/Time   CHOL 141 08/12/2014 0541   TRIG 76 08/12/2014 0541   HDL 51 08/12/2014 0541   CHOLHDL 2.8 08/12/2014 0541   VLDL 15 08/12/2014 0541   LDLCALC 75 08/12/2014 0541      Wt Readings from Last 3 Encounters:  08/21/14 118 lb (53.524 kg)  08/11/14 113 lb 5.1 oz (51.4 kg)  04/25/14 116 lb (52.617 kg)      Other studies Reviewed: Additional studies/ records that were reviewed today include: . Review of the above records demonstrates:    ASSESSMENT AND PLAN:  1. CAD -  she denies any episodes of chest discomfort.  2. Abdominal Aortic aneurism - her abdominal aortic aneurysm is palpable. She's followed by  Dr. Bridgett Larsson   3. Hyperlipidemia -  Stable  4.  Carotid artery disease  - she status post recent stroke. Continue current medications.     Current medicines are reviewed at length with the patient today.  The patient does not have concerns regarding medicines.  The following changes have been made:  no change  Labs/ tests ordered today include:  No orders of the defined types were placed in this encounter.     Disposition:   FU with me in  6 months    Signed, Nahser, Wonda Cheng, MD  10/21/2014 1:54 PM    [facility] [place], [place], [place]  [address] Phone: [phone]; Fax: [phone]

## 2014-10-21 NOTE — Patient Instructions (Signed)
Your physician recommends that you continue on your current medications as directed. Please refer to the Current Medication list given to you today.  Your physician wants you to follow-up in: 6 months with Dr. Nahser.  You will receive a reminder letter in the mail two months in advance. If you don't receive a letter, please call our office to schedule the follow-up appointment.  

## 2014-10-30 ENCOUNTER — Encounter: Payer: Self-pay | Admitting: Family

## 2014-10-31 ENCOUNTER — Ambulatory Visit (INDEPENDENT_AMBULATORY_CARE_PROVIDER_SITE_OTHER): Payer: Medicare Other | Admitting: Family

## 2014-10-31 ENCOUNTER — Encounter: Payer: Self-pay | Admitting: Family

## 2014-10-31 ENCOUNTER — Ambulatory Visit (HOSPITAL_COMMUNITY)
Admission: RE | Admit: 2014-10-31 | Discharge: 2014-10-31 | Disposition: A | Discharge status: Home / Self Care | Payer: Medicare Other | Source: Ambulatory Visit | Attending: Family | Admitting: Family

## 2014-10-31 VITALS — BP 109/54 | HR 53 | Resp 14 | Ht 64.0 in | Wt 114.0 lb

## 2014-10-31 DIAGNOSIS — Z87891 Personal history of nicotine dependence: Secondary | ICD-10-CM

## 2014-10-31 DIAGNOSIS — I714 Abdominal aortic aneurysm, without rupture, unspecified: Secondary | ICD-10-CM

## 2014-10-31 DIAGNOSIS — I6523 Occlusion and stenosis of bilateral carotid arteries: Secondary | ICD-10-CM | POA: Insufficient documentation

## 2014-10-31 NOTE — Progress Notes (Signed)
[facility] [place]  Established Abdominal Aortic Aneurysm  History of Present Illness  [name] is a 81 y.o. (Jan 21, 1924) female patient of Dr. Bridgett Larsson followed for known AAA, who presents with chief complaint: follow up for AAA. Previous studies demonstrate an AAA, measuring 4.63 cm. The patient does not have back or abdominal pain. The patient is a former smoker.  The patient denies claudication in legs with walking.  The patient reports history of TIA symptoms in 2013 and 2014, both manifested as transient expressive aphasia, no hemiparesis, no monocular blindness. No carotid Duplex result on file, Dr. Acie Fredrickson is her cardiologist, has a cardiac stent.  She had a stroke in January 2016, was evaluated at [facility] by Dorian Pod ,MD, [facility].  Plavix prescribed by Dr. Acie Fredrickson, since her TIA, per daughter.   Pt Diabetic: No  Pt smoker: former smoker, quit in 2012, daughter states she smokes around her mother but states she will no longer smoke around her mother.  Pt meds include: Statin :Yes Betablocker: Yes ASA: No Other anticoagulants/antiplatelets: Plavix   Past Medical History  Diagnosis Date  . DJD (degenerative joint disease)   . CAD (coronary artery disease)   . Hypertension   . Hyperlipidemia   . Abdominal aortic aneurysm   . Osteopenia   . Aortic insufficiency   . Broken hip     left  . Carotid artery occlusion   . Stroke 2013  and  Feb. 2016   Past Surgical History  Procedure Laterality Date  . Cataract extraction    . Abdominal hysterectomy    . Cholecystectomy    . Tonsillectomy    . Ptca    . Appendectomy    . Cardiac catheterization  2000    PTCA,and stenting to RCA  . Hip fracture surgery      Left hip ORIF  . Fracture surgery Left   . Eye surgery     Social History History   Social History  . Marital Status: Widowed    Spouse Name: N/A  . Number of Children: N/A  . Years of Education: N/A    Occupational History  . Not on file.   Social History Main Topics  . Smoking status: Former Smoker -- 50 years    Types: Cigarettes    Quit date: 01/09/2011  . Smokeless tobacco: Never Used  . Alcohol Use: No  . Drug Use: No  . Sexual Activity: Not on file   Other Topics Concern  . Not on file   Social History Narrative   Family History Family History  Problem Relation Age of Onset  . Coronary artery disease Mother   . Heart disease Mother   . Dementia Sister   . Heart disease Sister   . Coronary artery disease Brother   . Cancer Sister     Cervical or Ovarian    Current Outpatient Prescriptions on File Prior to Visit  Medication Sig Dispense Refill  . amLODipine (NORVASC) 10 MG tablet TAKE ONE TABLET BY MOUTH ONCE DAILY 30 tablet 6  . clopidogrel (PLAVIX) 75 MG tablet Take 1 tablet (75 mg total) by mouth daily. 31 tablet 11  . metoprolol tartrate (LOPRESSOR) 25 MG tablet TAKE ONE TABLET BY MOUTH TWICE DAILY 180 tablet 0  . Multiple Vitamins-Minerals (PRESERVISION AREDS 2 PO) Take by mouth 2 (two) times daily.    . nitroGLYCERIN (NITROSTAT) 0.4 MG SL tablet Place 1 tablet (0.4 mg total) under the tongue every 5 (five)  minutes as needed. 25 tablet 11  . pravastatin (PRAVACHOL) 40 MG tablet TAKE ONE TABLET BY MOUTH ONCE DAILY 30 tablet 0  . clobetasol cream (TEMOVATE) 4.53 % Apply 1 application topically as needed.     . hydrOXYzine (ATARAX/VISTARIL) 25 MG tablet Take 25 mg by mouth as needed.     . triamcinolone ointment (KENALOG) 0.1 % Apply 1 application topically as needed.      No current facility-administered medications on file prior to visit.   Allergies  Allergen Reactions  . Alendronate Sodium   . Chlordiazepoxide   . Librium   . Zocor [Simvastatin]     Leg cramps    ROS: See HPI for pertinent positives and negatives.  Physical Examination  Filed Vitals:   10/31/14 0908  BP: 109/54  Pulse: 53  Resp: 14  Height: 5\' 4"  (1.626 m)  Weight: 114 lb  (51.71 kg)  SpO2: 98%   Body mass index is 19.56 kg/(m^2).   General: A&O x 3, WD.  Pulmonary: Sym exp, good air movt, CTAB, no rales, rhonchi, or wheezing.  Cardiac: RRR, Nl S1, S2, no detected murmur.   Carotid Bruits  Left  Right    Negative  Negative   Aorta is palpable  Radial pulses are 2+ palpable and =   VASCULAR EXAM:  LE Pulses  LEFT  RIGHT   POPLITEAL  not palpable  not palpable   POSTERIOR TIBIAL  palpable  palpable   DORSALIS PEDIS  ANTERIOR TIBIAL  palpable  palpable    Gastrointestinal: soft, NTND, -G/R, - HSM, - palpable  Masses other than strongly palpable abdominal aortic pulse, - CVAT B.  Musculoskeletal: M/S 4/5 throughout, Extremities without ischemic changes.  Neurologic: CN 2-12 intact except has some hearing loss, Pain and light touch intact in extremities, Motor exam as listed above.        Non-Invasive Vascular Imaging  AAA Duplex (10/31/2014) ABDOMINAL AORTA DUPLEX EVALUATION    INDICATION: Follow up aneurysm    PREVIOUS INTERVENTION(S): None    DUPLEX EXAM:     LOCATION DIAMETER AP (cm) DIAMETER TRANSVERSE (cm) VELOCITIES (cm/sec)  Aorta Proximal 2.0 1.9 57  Aorta Mid 2.7 2.9 59  Aorta Distal 5.2 5.3 28  Right Common Iliac Artery 1.4 - 91  Left Common Iliac Artery .61 - 120    Previous max aortic diameter:  4.8 x 4.9 Date: 04/25/2014     ADDITIONAL FINDINGS: 1.6 cm lumen noted    IMPRESSION: 5.2 x 5.3 cm distal aorta aneurysm    Compared to the previous exam:  Increase in size     Medical Decision Making  The patient is a 81 y.o. female who presents with asymptomatic AAA with an increase in size from . Communicated with Dr. Acie Fredrickson who indicated that pt is high risk for AAA repair. She had a recent stroke in January 2016, was evaluated by neurohospitalist, please see his consult note. She has no hemiparesis, no speech difficulties today. She does have some hearing loss and mild cognitive  impairment. Carotid Duplex from 04/25/14 suggests less than 40% bilateral ICA stenosis.   Face to face time with patient was 25 minutes. Over 50% of this time was spent on counseling and coordination of care.     Based on this patient's exam and diagnostic studies, and after discussing with Dr. Bridgett Larsson, the patient will be scheduled for CTA abdomen/pelvis to determine if pt is a candidate for a stent graft repair of her AAA; she is high  risk for open repair.  Consideration for repair of AAA would be made when the size is 5.5 cm, growth > 1 cm/yr, and symptomatic status.  I emphasized the importance of maximal medical management including strict control of blood pressure, blood glucose, and lipid levels, antiplatelet agents, obtaining regular exercise, and continued cessation of smoking.   The patient was given information about AAA including signs, symptoms, treatment, and how to minimize the risk of enlargement and rupture of aneurysms.    The patient was advised to call [phone] should the patient experience sudden onset abdominal or back pain.   Thank you for allowing [place] to participate in this patient's care.  Clemon Chambers, RN, MSN, FNP-C [facility] [place] Office: [phone]  Clinic Physician: Bridgett Larsson  10/31/2014, 9:19 AM

## 2014-10-31 NOTE — Patient Instructions (Signed)
Abdominal Aortic Aneurysm An aneurysm is a weakened or damaged part of an artery wall that bulges from the normal force of blood pumping through the body. An abdominal aortic aneurysm is an aneurysm that occurs in the lower part of the aorta, the main artery of the body.  The major concern with an abdominal aortic aneurysm is that it can enlarge and burst (rupture) or blood can flow between the layers of the wall of the aorta through a tear (aorticdissection). Both of these conditions can cause bleeding inside the body and can be life threatening unless diagnosed and treated promptly. CAUSES  The exact cause of an abdominal aortic aneurysm is unknown. Some contributing factors are:   A hardening of the arteries caused by the buildup of fat and other substances in the lining of a blood vessel (arteriosclerosis).  Inflammation of the walls of an artery (arteritis).   Connective tissue diseases, such as Marfan syndrome.   Abdominal trauma.   An infection, such as syphilis or staphylococcus, in the wall of the aorta (infectious aortitis) caused by bacteria. RISK FACTORS  Risk factors that contribute to an abdominal aortic aneurysm may include:  Age older than 60 years.   High blood pressure (hypertension).  Female gender.  Ethnicity (white race).  Obesity.  Family history of aneurysm (first degree relatives only).  Tobacco use. PREVENTION  The following healthy lifestyle habits may help decrease your risk of abdominal aortic aneurysm:  Quitting smoking. Smoking can raise your blood pressure and cause arteriosclerosis.  Limiting or avoiding alcohol.  Keeping your blood pressure, blood sugar level, and cholesterol levels within normal limits.  Decreasing your salt intake. In somepeople, too much salt can raise blood pressure and increase your risk of abdominal aortic aneurysm.  Eating a diet low in saturated fats and cholesterol.  Increasing your fiber intake by including  whole grains, vegetables, and fruits in your diet. Eating these foods may help lower blood pressure.  Maintaining a healthy weight.  Staying physically active and exercising regularly. SYMPTOMS  The symptoms of abdominal aortic aneurysm may vary depending on the size and rate of growth of the aneurysm.Most grow slowly and do not have any symptoms. When symptoms do occur, they may include:  Pain (abdomen, side, lower back, or groin). The pain may vary in intensity. A sudden onset of severe pain may indicate that the aneurysm has ruptured.  Feeling full after eating only small amounts of food.  Nausea or vomiting or both.  Feeling a pulsating lump in the abdomen.  Feeling faint or passing out. DIAGNOSIS  Since most unruptured abdominal aortic aneurysms have no symptoms, they are often discovered during diagnostic exams for other conditions. An aneurysm may be found during the following procedures:  Ultrasonography (A one-time screening for abdominal aortic aneurysm by ultrasonography is also recommended for all men aged 65-75 years who have ever smoked).  X-ray exams.  A computed tomography (CT).  Magnetic resonance imaging (MRI).  Angiography or arteriography. TREATMENT  Treatment of an abdominal aortic aneurysm depends on the size of your aneurysm, your age, and risk factors for rupture. Medication to control blood pressure and pain may be used to manage aneurysms smaller than 6 cm. Regular monitoring for enlargement may be recommended by your caregiver if:  The aneurysm is 3-4 cm in size (an annual ultrasonography may be recommended).  The aneurysm is 4-4.5 cm in size (an ultrasonography every 6 months may be recommended).  The aneurysm is larger than 4.5 cm in   size (your caregiver may ask that you be examined by a vascular surgeon). If your aneurysm is larger than 6 cm, surgical repair may be recommended. There are two main methods for repair of an aneurysm:   Endovascular  repair (a minimally invasive surgery). This is done most often.  Open repair. This method is used if an endovascular repair is not possible. Document Released: 04/20/2005 Document Revised: 11/05/2012 Document Reviewed: 08/10/2012 ExitCare Patient Information 2015 [employer]. This information is not intended to replace advice given to you by your health care provider. Make sure you discuss any questions you have with your health care provider.  

## 2014-11-04 ENCOUNTER — Telehealth: Payer: Self-pay | Admitting: Vascular Surgery

## 2014-11-04 NOTE — Telephone Encounter (Signed)
I have sent a request to the clinical team to enter order for CTA, dpm

## 2014-11-04 NOTE — Telephone Encounter (Signed)
-----   Message from Viann Fish, NP sent at 10/31/2014  8:05 PM EDT ----- Regarding: schedule CTA abd/pelvis for AAA eval Please call patient's daughter on her mobile phone # [phone]  to schedule a CTA abd/pelvis for evaluation of AAA, and see Dr. Bridgett Larsson later that day in the office. Pt is 81 years old, hard of hearing, and has some mild cognitive impairment. This was decided after communicating with Dr. Bridgett Larsson and Dr. Acie Fredrickson, and after the pt and daughter left the office.  I spoke with pt's daughter this evening re this and she is expecting a call early next week to schedule this.  I neglected to mention to pt's daughter that pt and her daughter would be following up with Dr. Bridgett Larsson later that day after the CTA to discuss the results, but I did indicate to her that Dr. Bridgett Larsson would discuss results with them, but not that it would likely be later that same day. Please let her know that.  Please cancel the 3 month follow up that I requested before pt left the office for AAA Duplex and visit with me.  Please let me know if any clarification is needed.  Thank you, Vinnie Level

## 2014-11-05 ENCOUNTER — Other Ambulatory Visit: Payer: Self-pay | Admitting: *Deleted

## 2014-11-05 DIAGNOSIS — I714 Abdominal aortic aneurysm, without rupture, unspecified: Secondary | ICD-10-CM

## 2014-11-05 NOTE — Telephone Encounter (Signed)
Spoke with patients daughter to schedule the appointment, dpm

## 2014-11-18 ENCOUNTER — Other Ambulatory Visit: Payer: Self-pay | Admitting: Cardiovascular Disease

## 2014-11-20 ENCOUNTER — Encounter: Payer: Self-pay | Admitting: Vascular Surgery

## 2014-11-20 ENCOUNTER — Ambulatory Visit: Payer: Medicare Other | Admitting: Internal Medicine

## 2014-11-21 ENCOUNTER — Encounter: Payer: Self-pay | Admitting: Vascular Surgery

## 2014-11-21 ENCOUNTER — Ambulatory Visit (INDEPENDENT_AMBULATORY_CARE_PROVIDER_SITE_OTHER): Payer: Medicare Other | Admitting: Vascular Surgery

## 2014-11-21 ENCOUNTER — Ambulatory Visit
Admission: RE | Admit: 2014-11-21 | Discharge: 2014-11-21 | Disposition: A | Discharge status: Home / Self Care | Payer: Medicare Other | Source: Ambulatory Visit | Attending: Vascular Surgery | Admitting: Vascular Surgery

## 2014-11-21 VITALS — BP 126/55 | HR 72 | Resp 14 | Ht 64.0 in | Wt 115.0 lb

## 2014-11-21 DIAGNOSIS — I6523 Occlusion and stenosis of bilateral carotid arteries: Secondary | ICD-10-CM | POA: Diagnosis not present

## 2014-11-21 DIAGNOSIS — I714 Abdominal aortic aneurysm, without rupture, unspecified: Secondary | ICD-10-CM

## 2014-11-21 DIAGNOSIS — K5730 Diverticulosis of large intestine without perforation or abscess without bleeding: Secondary | ICD-10-CM | POA: Diagnosis not present

## 2014-11-21 DIAGNOSIS — I716 Thoracoabdominal aortic aneurysm, without rupture, unspecified: Secondary | ICD-10-CM

## 2014-11-21 NOTE — Progress Notes (Signed)
    Established Abdominal Aortic Aneurysm  History of Present Illness  The patient is a 81 y.o. (08-Jan-1924) female who presents with chief complaint: follow up for AAA.  Previous studies demonstrate an AAA, measuring 5.7 cm.  The patient does not have back or abdominal pain.  The patient is not a smoker.  The patient had today a CT without contrast due to some CKD.  The patient's PMH, PSH, SH, FamHx, Med, and Allergies are unchanged from 10/31/14.  On ROS today: no embolic sx, no back or abd pain  Physical Examination  Filed Vitals:   11/21/14 1513 11/21/14 1514  BP: 133/49 126/55  Pulse: 70 72  Resp: 14   Height: 5\' 4"  (1.626 m)   Weight: 115 lb (52.164 kg)    Body mass index is 19.73 kg/(m^2).  General: A&O x 3, WD, elderly, thin  Pulmonary: Sym exp, good air movt, CTAB, no rales, rhonchi, & wheezing  Cardiac: RRR, Nl S1, S2, no Murmurs, rubs or gallops  Vascular: Vessel Right Left  Radial Palpable Palpable  Brachial Palpable Palpable  Carotid Palpable, without bruit Palpable, without bruit  Aorta Not palpable N/A  Femoral Palpable Palpable  Popliteal Not palpable Not palpable  PT Not Palpable Not Palpable  DP Not Palpable Not Palpable   Gastrointestinal: soft, NTND, -G/R, - HSM, - masses, - CVAT B  Musculoskeletal: M/S 5/5 throughout , Extremities without ischemic changes   Neurologic: Pain and light touch intact in extremities , Motor exam as listed above  Non-con CT Abd/pelvis (11/21/2014) 1. 5.7 cm in diameter infrarenal abdominal aortic aneurysm extends to the bifurcation. No compelling findings of current leak. 2. Extensive atherosclerosis. 3. Soft tissue density in the subcutaneous tissues posterior to the lower sacrum -query inflammation or ulceration. 4. Mild unlikely chronic interstitial accentuation in both lung bases. 5. Coronary artery atherosclerosis. 6. Prominent sigmoid diverticulosis.  Based on this patient's CT, she has a bilobar  thoracoabdominal aneurysm.  At the top of the CT, the thoracic component is already nearly 4.0 cm with the abdominal component 5.7 cm.   Medical Decision Making  The patient is a 81 y.o. female who presents with: asymptomatic thoracoabdominal aneurysm with increasing abdominal size.   I had a frank discussion with the patient of her rupture risk.  At her elevated age, she is obviously at increased risk, even with EVAR.  We discussed possibly repairing this TAAA as essentially two aneurysms: repairing the infrarenal segment and then repairing the thoracic segment and possibly mesenteric and renal segments separately.  At this point, the patient has decided to managed her TAAA: EXPECTANT MANAGEMENT, i.e. No surgical intervention with only comfort measures if she ruptures.  In this setting, no further surveillance is necessary.  The patient would like all of her doctors know that she does NOT want any attempt at repair in the event of rupture.  Her daughter was in the room to witness this discussion.  Thank you for allowing [place] to participate in this patient's care.  Adele Barthel, MD [facility] [place] Office: [phone] Pager: [phone]  11/21/2014, 5:51 PM

## 2014-11-28 ENCOUNTER — Ambulatory Visit (INDEPENDENT_AMBULATORY_CARE_PROVIDER_SITE_OTHER): Payer: Medicare Other | Admitting: Internal Medicine

## 2014-11-28 ENCOUNTER — Encounter: Payer: Self-pay | Admitting: Internal Medicine

## 2014-11-28 VITALS — BP 110/70 | HR 55 | Temp 98.3°F | Resp 18 | Ht 64.0 in | Wt 117.0 lb

## 2014-11-28 DIAGNOSIS — I716 Thoracoabdominal aortic aneurysm, without rupture, unspecified: Secondary | ICD-10-CM

## 2014-11-28 DIAGNOSIS — E785 Hyperlipidemia, unspecified: Secondary | ICD-10-CM | POA: Diagnosis not present

## 2014-11-28 DIAGNOSIS — I6523 Occlusion and stenosis of bilateral carotid arteries: Secondary | ICD-10-CM | POA: Diagnosis not present

## 2014-11-28 DIAGNOSIS — I10 Essential (primary) hypertension: Secondary | ICD-10-CM | POA: Diagnosis not present

## 2014-11-28 NOTE — Progress Notes (Signed)
Subjective:    Patient ID: [name], female    DOB: 04/15/1924, 81 y.o.   MRN: [id number]  HPI  81 year old patient who is seen today for follow-up.  She has essential hypertension, dyslipidemia and coronary artery disease.  She has been seen recently by vascular surgery for follow-up of a thoracic abdominal aneurysm.  Conservative management has been decided.  Clinically doing well.  No focal neurological symptoms or exertional chest pain.  Past Medical History  Diagnosis Date  . DJD (degenerative joint disease)   . CAD (coronary artery disease)   . Hypertension   . Hyperlipidemia   . Abdominal aortic aneurysm   . Osteopenia   . Aortic insufficiency   . Broken hip     left  . Carotid artery occlusion   . Stroke 2013  and  Feb. 2016    History   Social History  . Marital Status: Widowed    Spouse Name: N/A  . Number of Children: N/A  . Years of Education: N/A   Occupational History  . Not on file.   Social History Main Topics  . Smoking status: Former Smoker -- 50 years    Types: Cigarettes    Quit date: 01/09/2011  . Smokeless tobacco: Never Used  . Alcohol Use: No  . Drug Use: No  . Sexual Activity: Not on file   Other Topics Concern  . Not on file   Social History Narrative    Past Surgical History  Procedure Laterality Date  . Cataract extraction    . Abdominal hysterectomy    . Cholecystectomy    . Tonsillectomy    . Ptca    . Appendectomy    . Cardiac catheterization  2000    PTCA,and stenting to RCA  . Hip fracture surgery      Left hip ORIF  . Fracture surgery Left   . Eye surgery      Family History  Problem Relation Age of Onset  . Coronary artery disease Mother   . Heart disease Mother   . Dementia Sister   . Heart disease Sister   . Coronary artery disease Brother   . Cancer Sister     Cervical or Ovarian    Allergies  Allergen Reactions  . Alendronate Sodium   . Chlordiazepoxide   . Librium   . Zocor [Simvastatin]      Leg cramps    Current Outpatient Prescriptions on File Prior to Visit  Medication Sig Dispense Refill  . amLODipine (NORVASC) 10 MG tablet TAKE ONE TABLET BY MOUTH ONCE DAILY 30 tablet 6  . clopidogrel (PLAVIX) 75 MG tablet Take 1 tablet (75 mg total) by mouth daily. 31 tablet 11  . metoprolol tartrate (LOPRESSOR) 25 MG tablet TAKE ONE TABLET BY MOUTH TWICE DAILY 180 tablet 0  . Multiple Vitamins-Minerals (PRESERVISION AREDS 2 PO) Take by mouth 2 (two) times daily.    . nitroGLYCERIN (NITROSTAT) 0.4 MG SL tablet Place 1 tablet (0.4 mg total) under the tongue every 5 (five) minutes as needed. 25 tablet 11  . pravastatin (PRAVACHOL) 40 MG tablet TAKE ONE TABLET BY MOUTH ONCE DAILY 30 tablet 6  . clobetasol cream (TEMOVATE) 5.95 % Apply 1 application topically as needed.     . triamcinolone ointment (KENALOG) 0.1 % Apply 1 application topically as needed.      No current facility-administered medications on file prior to visit.    BP 110/70 mmHg  Pulse 55  Temp(Src) 98.3 F (  36.8 C) (Oral)  Resp 18  Ht 5\' 4"  (1.626 m)  Wt 117 lb (53.071 kg)  BMI 20.07 kg/m2  SpO2 94%     Review of Systems  Constitutional: Negative.   HENT: Negative for congestion, dental problem, hearing loss, rhinorrhea, sinus pressure, sore throat and tinnitus.   Eyes: Negative for pain, discharge and visual disturbance.  Respiratory: Negative for cough and shortness of breath.   Cardiovascular: Negative for chest pain, palpitations and leg swelling.  Gastrointestinal: Negative for nausea, vomiting, abdominal pain, diarrhea, constipation, blood in stool and abdominal distention.  Genitourinary: Negative for dysuria, urgency, frequency, hematuria, flank pain, vaginal bleeding, vaginal discharge, difficulty urinating, vaginal pain and pelvic pain.  Musculoskeletal: Negative for joint swelling, arthralgias and gait problem.  Skin: Negative for rash.  Neurological: Negative for dizziness, syncope, speech  difficulty, weakness, numbness and headaches.  Hematological: Negative for adenopathy.  Psychiatric/Behavioral: Negative for behavioral problems, dysphoric mood and agitation. The patient is not nervous/anxious.        Objective:   Physical Exam  Constitutional: She is oriented to person, place, and time. She appears well-developed and well-nourished.  Blood pressure low normal  HENT:  Head: Normocephalic.  Right Ear: External ear normal.  Left Ear: External ear normal.  Mouth/Throat: Oropharynx is clear and moist.  Eyes: Conjunctivae and EOM are normal. Pupils are equal, round, and reactive to light.  Neck: Normal range of motion. Neck supple. No thyromegaly present.  Cardiovascular: Normal rate, regular rhythm, normal heart sounds and intact distal pulses.   Pulmonary/Chest: Effort normal and breath sounds normal.  Abdominal: Soft. Bowel sounds are normal. She exhibits no mass. There is no tenderness.  Musculoskeletal: Normal range of motion.  Lymphadenopathy:    She has no cervical adenopathy.  Neurological: She is alert and oriented to person, place, and time.  Skin: Skin is warm and dry. No rash noted.  Psychiatric: She has a normal mood and affect. Her behavior is normal.          Assessment & Plan:   Hypertension.  Well-controlled Dyslipidemia.  Continue statin therapy Coronary artery disease Cerebrovascular disease  History of the thoraco- abdominal aneurysm aneurysm.  Nonsurgical management is patient's preference  We'll continue aggressive risk factor modification No change in therapy Recheck 6 months

## 2014-11-28 NOTE — Patient Instructions (Signed)
Limit your sodium (Salt) intake  Return in 6 months for follow-up  

## 2014-12-05 ENCOUNTER — Other Ambulatory Visit: Payer: Self-pay | Admitting: Cardiovascular Disease

## 2015-02-06 ENCOUNTER — Ambulatory Visit: Payer: Medicare Other | Admitting: Family

## 2015-02-06 ENCOUNTER — Other Ambulatory Visit (HOSPITAL_COMMUNITY): Payer: Medicare Other

## 2015-03-01 ENCOUNTER — Emergency Department (HOSPITAL_COMMUNITY): Payer: Medicare Other

## 2015-03-01 ENCOUNTER — Encounter (HOSPITAL_COMMUNITY): Payer: Self-pay | Admitting: Emergency Medicine

## 2015-03-01 ENCOUNTER — Emergency Department (HOSPITAL_COMMUNITY)
Admission: EM | Admit: 2015-03-01 | Discharge: 2015-03-01 | Disposition: A | Discharge status: Home / Self Care | Payer: Medicare Other | Attending: Emergency Medicine | Admitting: Emergency Medicine

## 2015-03-01 DIAGNOSIS — E785 Hyperlipidemia, unspecified: Secondary | ICD-10-CM | POA: Insufficient documentation

## 2015-03-01 DIAGNOSIS — Y9301 Activity, walking, marching and hiking: Secondary | ICD-10-CM | POA: Insufficient documentation

## 2015-03-01 DIAGNOSIS — I672 Cerebral atherosclerosis: Secondary | ICD-10-CM | POA: Diagnosis not present

## 2015-03-01 DIAGNOSIS — Z7902 Long term (current) use of antithrombotics/antiplatelets: Secondary | ICD-10-CM | POA: Diagnosis not present

## 2015-03-01 DIAGNOSIS — W19XXXA Unspecified fall, initial encounter: Secondary | ICD-10-CM

## 2015-03-01 DIAGNOSIS — Y92007 Garden or yard of unspecified non-institutional (private) residence as the place of occurrence of the external cause: Secondary | ICD-10-CM | POA: Insufficient documentation

## 2015-03-01 DIAGNOSIS — S6992XA Unspecified injury of left wrist, hand and finger(s), initial encounter: Secondary | ICD-10-CM | POA: Diagnosis present

## 2015-03-01 DIAGNOSIS — Z8739 Personal history of other diseases of the musculoskeletal system and connective tissue: Secondary | ICD-10-CM | POA: Diagnosis not present

## 2015-03-01 DIAGNOSIS — S4992XA Unspecified injury of left shoulder and upper arm, initial encounter: Secondary | ICD-10-CM | POA: Diagnosis not present

## 2015-03-01 DIAGNOSIS — W1839XA Other fall on same level, initial encounter: Secondary | ICD-10-CM | POA: Insufficient documentation

## 2015-03-01 DIAGNOSIS — I2510 Atherosclerotic heart disease of native coronary artery without angina pectoris: Secondary | ICD-10-CM | POA: Diagnosis not present

## 2015-03-01 DIAGNOSIS — T148 Other injury of unspecified body region: Secondary | ICD-10-CM | POA: Diagnosis not present

## 2015-03-01 DIAGNOSIS — Z9889 Other specified postprocedural states: Secondary | ICD-10-CM | POA: Diagnosis not present

## 2015-03-01 DIAGNOSIS — M5030 Other cervical disc degeneration, unspecified cervical region: Secondary | ICD-10-CM | POA: Diagnosis not present

## 2015-03-01 DIAGNOSIS — Z87891 Personal history of nicotine dependence: Secondary | ICD-10-CM | POA: Diagnosis not present

## 2015-03-01 DIAGNOSIS — I6529 Occlusion and stenosis of unspecified carotid artery: Secondary | ICD-10-CM | POA: Insufficient documentation

## 2015-03-01 DIAGNOSIS — Y998 Other external cause status: Secondary | ICD-10-CM | POA: Insufficient documentation

## 2015-03-01 DIAGNOSIS — Z8673 Personal history of transient ischemic attack (TIA), and cerebral infarction without residual deficits: Secondary | ICD-10-CM | POA: Insufficient documentation

## 2015-03-01 DIAGNOSIS — S42202A Unspecified fracture of upper end of left humerus, initial encounter for closed fracture: Secondary | ICD-10-CM | POA: Diagnosis not present

## 2015-03-01 DIAGNOSIS — I10 Essential (primary) hypertension: Secondary | ICD-10-CM | POA: Diagnosis not present

## 2015-03-01 DIAGNOSIS — M25512 Pain in left shoulder: Secondary | ICD-10-CM | POA: Diagnosis not present

## 2015-03-01 DIAGNOSIS — Z79899 Other long term (current) drug therapy: Secondary | ICD-10-CM | POA: Diagnosis not present

## 2015-03-01 DIAGNOSIS — S199XXA Unspecified injury of neck, initial encounter: Secondary | ICD-10-CM | POA: Diagnosis not present

## 2015-03-01 DIAGNOSIS — S0990XA Unspecified injury of head, initial encounter: Secondary | ICD-10-CM | POA: Diagnosis not present

## 2015-03-01 LAB — URINALYSIS, ROUTINE W REFLEX MICROSCOPIC
Bilirubin Urine: NEGATIVE
GLUCOSE, UA: NEGATIVE mg/dL
HGB URINE DIPSTICK: NEGATIVE
KETONES UR: NEGATIVE mg/dL
NITRITE: NEGATIVE
PROTEIN: 30 mg/dL — AB
Specific Gravity, Urine: 1.028 (ref 1.005–1.030)
UROBILINOGEN UA: 0.2 mg/dL (ref 0.0–1.0)
pH: 5.5 (ref 5.0–8.0)

## 2015-03-01 LAB — BASIC METABOLIC PANEL
ANION GAP: 8 (ref 5–15)
BUN: 21 mg/dL — ABNORMAL HIGH (ref 6–20)
CO2: 22 mmol/L (ref 22–32)
CREATININE: 1.36 mg/dL — AB (ref 0.44–1.00)
Calcium: 9 mg/dL (ref 8.9–10.3)
Chloride: 107 mmol/L (ref 101–111)
GFR calc non Af Amer: 33 mL/min — ABNORMAL LOW (ref >60)
GFR, EST AFRICAN AMERICAN: 39 mL/min — AB (ref >60)
Glucose, Bld: 130 mg/dL — ABNORMAL HIGH (ref 65–99)
POTASSIUM: 4.1 mmol/L (ref 3.5–5.1)
Sodium: 137 mmol/L (ref 135–145)

## 2015-03-01 LAB — URINE MICROSCOPIC-ADD ON

## 2015-03-01 LAB — CBC WITH DIFFERENTIAL/PLATELET
Basophils Absolute: 0 10*3/uL (ref 0.0–0.1)
Basophils Relative: 0 % (ref 0–1)
EOS ABS: 0 10*3/uL (ref 0.0–0.7)
Eosinophils Relative: 0 % (ref 0–5)
HEMATOCRIT: 37.3 % (ref 36.0–46.0)
Hemoglobin: 11.8 g/dL — ABNORMAL LOW (ref 12.0–15.0)
LYMPHS PCT: 8 % — AB (ref 12–46)
Lymphs Abs: 0.8 10*3/uL (ref 0.7–4.0)
MCH: 26.7 pg (ref 26.0–34.0)
MCHC: 31.6 g/dL (ref 30.0–36.0)
MCV: 84.4 fL (ref 78.0–100.0)
Monocytes Absolute: 1.1 10*3/uL — ABNORMAL HIGH (ref 0.1–1.0)
Monocytes Relative: 11 % (ref 3–12)
NEUTROS ABS: 7.8 10*3/uL — AB (ref 1.7–7.7)
Neutrophils Relative %: 81 % — ABNORMAL HIGH (ref 43–77)
Platelets: 188 10*3/uL (ref 150–400)
RBC: 4.42 MIL/uL (ref 3.87–5.11)
RDW: 14.9 % (ref 11.5–15.5)
WBC: 9.7 10*3/uL (ref 4.0–10.5)

## 2015-03-01 LAB — CK: Total CK: 35 U/L — ABNORMAL LOW (ref 38–234)

## 2015-03-01 MED ORDER — ONDANSETRON HCL 4 MG/2ML IJ SOLN
4.0000 mg | Freq: Once | INTRAMUSCULAR | Status: AC
  Administered 2015-03-01: 4 mg via INTRAVENOUS
  Filled 2015-03-01: qty 2

## 2015-03-01 MED ORDER — MORPHINE SULFATE 2 MG/ML IJ SOLN
2.0000 mg | Freq: Once | INTRAMUSCULAR | Status: AC
  Administered 2015-03-01: 2 mg via INTRAVENOUS
  Filled 2015-03-01: qty 1

## 2015-03-01 MED ORDER — ACETAMINOPHEN 500 MG PO TABS
1000.0000 mg | ORAL_TABLET | Freq: Once | ORAL | Status: AC
  Administered 2015-03-01: 1000 mg via ORAL
  Filled 2015-03-01: qty 2

## 2015-03-01 MED ORDER — OXYCODONE HCL 5 MG PO TABS: 2.5000 mg | ORAL_TABLET | ORAL | Status: AC | PRN

## 2015-03-01 NOTE — ED Notes (Signed)
Family at bedside. 

## 2015-03-01 NOTE — ED Provider Notes (Signed)
CSN: [id number]     Arrival date & time 03/01/15  0603 History   First MD Initiated Contact with Patient 03/01/15 [phone]     Chief Complaint  Patient presents with  . Fall     (Consider location/radiation/quality/duration/timing/severity/associated sxs/prior Treatment) Patient is a 81 y.o. female presenting with fall. The history is provided by the patient.  Fall This is a new problem. The current episode started yesterday. The problem occurs every several days. The problem has not changed since onset.Pertinent negatives include no chest pain, no headaches and no shortness of breath. Nothing aggravates the symptoms. Nothing relieves the symptoms. She has tried nothing for the symptoms. The treatment provided no relief.   81 yo F with a chief complaint of a fall. This been a recurrent problem for her. Unsteadiness in the feet per the family. Still taking Plavix. Patient was walking in the backyard yesterday and had a witnessed fall. No noted loss of consciousness. After that patient was complaining of left arm pain. This is worsened today. This morning she was found on the floor in the kitchen. Suspect repeat fall. Patient denies other areas of pain.  Past Medical History  Diagnosis Date  . DJD (degenerative joint disease)   . CAD (coronary artery disease)   . Hypertension   . Hyperlipidemia   . Abdominal aortic aneurysm   . Osteopenia   . Aortic insufficiency   . Broken hip     left  . Carotid artery occlusion   . Stroke 2013  and  Feb. 2016   Past Surgical History  Procedure Laterality Date  . Cataract extraction    . Abdominal hysterectomy    . Cholecystectomy    . Tonsillectomy    . Ptca    . Appendectomy    . Cardiac catheterization  2000    PTCA,and stenting to RCA  . Hip fracture surgery      Left hip ORIF  . Fracture surgery Left   . Eye surgery     Family History  Problem Relation Age of Onset  . Coronary artery disease Mother   . Heart disease Mother   .  Dementia Sister   . Heart disease Sister   . Coronary artery disease Brother   . Cancer Sister     Cervical or Ovarian   History  Substance Use Topics  . Smoking status: Former Smoker -- 50 years    Types: Cigarettes    Quit date: 01/09/2011  . Smokeless tobacco: Never Used  . Alcohol Use: No   OB History    No data available     Review of Systems  Constitutional: Negative for fever and chills.  HENT: Negative for congestion and rhinorrhea.   Eyes: Negative for redness and visual disturbance.  Respiratory: Negative for shortness of breath and wheezing.   Cardiovascular: Negative for chest pain and palpitations.  Gastrointestinal: Negative for nausea and vomiting.  Genitourinary: Negative for dysuria and urgency.  Musculoskeletal: Negative for myalgias and arthralgias.  Skin: Negative for pallor and wound.  Neurological: Negative for dizziness and headaches.      Allergies  Alendronate sodium; Chlordiazepoxide; Librium; and Zocor  Home Medications   Prior to Admission medications   Medication Sig Start Date End Date Taking? Authorizing Provider  amLODipine (NORVASC) 10 MG tablet TAKE ONE TABLET BY MOUTH ONCE DAILY 10/21/14  Yes Thayer Headings, MD  clopidogrel (PLAVIX) 75 MG tablet Take 1 tablet (75 mg total) by mouth daily. 10/21/14  Yes Arnette Norris  Deboraha Sprang, MD  metoprolol tartrate (LOPRESSOR) 25 MG tablet Take 1 tablet (25 mg total) by mouth 2 (two) times daily. 12/05/14  Yes Thayer Headings, MD  Multiple Vitamins-Minerals (PRESERVISION AREDS 2 PO) Take by mouth 2 (two) times daily.   Yes Historical Provider, MD  pravastatin (PRAVACHOL) 40 MG tablet TAKE ONE TABLET BY MOUTH ONCE DAILY 11/19/14  Yes Thayer Headings, MD  clobetasol cream (TEMOVATE) 2.69 % Apply 1 application topically daily as needed. Apply to left leg as needed 11/29/13   Historical Provider, MD  nitroGLYCERIN (NITROSTAT) 0.4 MG SL tablet Place 1 tablet (0.4 mg total) under the tongue every 5 (five) minutes as  needed. 09/15/11   Thayer Headings, MD  triamcinolone ointment (KENALOG) 0.1 % Apply 1 application topically daily as needed. Apply to left leg as needed 08/30/13   Historical Provider, MD   BP 142/59 mmHg  Pulse 80  Temp(Src) 98.1 F (36.7 C) (Oral)  Resp 18  SpO2 88% Physical Exam  Constitutional: She is oriented to person, place, and time. She appears well-developed and well-nourished. No distress.  HENT:  Head: Normocephalic and atraumatic.  Eyes: EOM are normal. Pupils are equal, round, and reactive to light.  Neck: Normal range of motion. Neck supple.  Cardiovascular: Normal rate and regular rhythm.  Exam reveals no gallop and no friction rub.   No murmur heard. Pulmonary/Chest: Effort normal. She has no wheezes. She has no rales.  Abdominal: Soft. She exhibits no distension. There is no tenderness. There is no rebound and no guarding.  Musculoskeletal: She exhibits tenderness (patient complaining of left midshaft humerus pain. No other areas of bony tenderness. Pulse motor and sensation intact to the left upper extremity.). She exhibits no edema.  Neurological: She is alert and oriented to person, place, and time.  Skin: Skin is warm and dry. She is not diaphoretic.  Psychiatric: She has a normal mood and affect. Her behavior is normal.    ED Course  Procedures (including critical care time) Labs Review Labs Reviewed - No data to display  Imaging Review Dg Shoulder Left  03/01/2015   CLINICAL DATA:  [place] on the left shoulder  EXAM: LEFT SHOULDER - 2+ VIEW  COMPARISON:  None.  FINDINGS: There is no evidence of fracture or dislocation. There is no evidence of arthropathy or other focal bone abnormality. Soft tissues are unremarkable.  IMPRESSION: Negative.   Electronically Signed   By: Andreas Newport M.D.   On: 03/01/2015 06:59     EKG Interpretation None      MDM   Final diagnoses:  None    81 yo F with a chief complaint of a fall. Multiple times in the past couple  days. Patient does not remember falling. We'll obtain a CT head CT C-spine x-ray of the left humerus. No other areas of bony tenderness. Basic laboratory evaluation. EKG.  Proximal left humerus fx on xray as read by me.  Spoke with ortho on call for [facility] as patient a clinic of Dr. Doran Durand, discussed imaging, will place in sling, non op, follow up this week in clinic.   6:24 PM:  I have discussed the diagnosis/risks/treatment options with the patient and family and believe the pt to be eligible for discharge home to follow-up with Ortho. We also discussed returning to the ED immediately if new or worsening sx occur. We discussed the sx which are most concerning (e.g., sudden worsening pain, neuro deficit.) that necessitate immediate return. Medications administered  to the patient during their visit and any new prescriptions provided to the patient are listed below.  Medications given during this visit Medications  acetaminophen (TYLENOL) tablet 1,000 mg (1,000 mg Oral Given 03/01/15 0841)  morphine 2 MG/ML injection 2 mg (2 mg Intravenous Given 03/01/15 1000)  ondansetron (ZOFRAN) injection 4 mg (4 mg Intravenous Given 03/01/15 1000)    Discharge Medication List as of 03/01/2015  9:53 AM    START taking these medications   Details  oxyCODONE (ROXICODONE) 5 MG immediate release tablet Take 0.5 tablets (2.5 mg total) by mouth every 4 (four) hours as needed for severe pain., Starting 03/01/2015, Until Discontinued, Print         The patient appears reasonably screen and/or stabilized for discharge and I doubt any other medical condition or other [facility] requiring further screening, evaluation, or treatment in the ED at this time prior to discharge.    Deno Etienne, DO 03/01/15 [id number]

## 2015-03-01 NOTE — Discharge Instructions (Signed)
Humerus Fracture, Treated with Immobilization  The humerus is the large bone in your upper arm. You have a broken (fractured) humerus. These fractures are easily diagnosed with X-rays.  TREATMENT   Simple fractures which will heal without disability are treated with simple immobilization. Immobilization means you will wear a cast, splint, or sling. You have a fracture which will do well with immobilization. The fracture will heal well simply by being held in a good position until it is stable enough to begin range of motion exercises. Do not take part in activities which would further injure your arm.   HOME CARE INSTRUCTIONS    Put ice on the injured area.   Put ice in a plastic bag.   Place a towel between your skin and the bag.   Leave the ice on for 15-20 minutes, 03-04 times a day.   If you have a cast:   Do not scratch the skin under the cast using sharp or pointed objects.   Check the skin around the cast every day. You may put lotion on any red or sore areas.   Keep your cast dry and clean.   If you have a splint:   Wear the splint as directed.   Keep your splint dry and clean.   You may loosen the elastic around the splint if your fingers become numb, tingle, or turn cold or blue.   If you have a sling:   Wear the sling as directed.   Do not put pressure on any part of your cast or splint until it is fully hardened.   Your cast or splint can be protected during bathing with a plastic bag. Do not lower the cast or splint into water.   Only take over-the-counter or prescription medicines for pain, discomfort, or fever as directed by your caregiver.   Do range of motion exercises as instructed by your caregiver.   Follow up as directed by your caregiver. This is very important in order to avoid permanent injury or disability and chronic pain.  SEEK IMMEDIATE MEDICAL CARE IF:    Your skin or nails in the injured arm turn blue or gray.   Your arm feels cold or numb.   You develop severe  pain in the injured arm.   You are having problems with the medicines you were given.  MAKE SURE YOU:    Understand these instructions.   Will watch your condition.   Will get help right away if you are not doing well or get worse.  Document Released: 10/17/2000 Document Revised: 10/03/2011 Document Reviewed: 08/25/2010  Exit[employer] Patient Information 2015 [employer]. This information is not intended to replace advice given to you by your health care provider. Make sure you discuss any questions you have with your health care provider.

## 2015-03-01 NOTE — ED Notes (Signed)
Bed: LM76 Expected date:  Expected time:  Means of arrival:  Comments: 35F fall, could not get up/shoulder back pain

## 2015-03-01 NOTE — ED Notes (Signed)
Pt presents from home via EMS for unwitnessed fall. Found by daughter on back, c/o left shoulder pain. No swelling or deformity to same, pain with movement.   Last VS 133/68, 86hr, 14resp, 92%RA.  20g R forearm

## 2015-03-01 NOTE — ED Notes (Signed)
Pt 02 sat on room air 87%, placed on 2lpm nasal cannula.

## 2015-03-01 NOTE — ED Notes (Signed)
Pt unavailable for lab draws, pt in CT

## 2015-03-01 NOTE — ED Notes (Signed)
Pt takes Plavix

## 2015-03-02 DIAGNOSIS — S42295A Other nondisplaced fracture of upper end of left humerus, initial encounter for closed fracture: Secondary | ICD-10-CM | POA: Diagnosis not present

## 2015-03-17 DIAGNOSIS — S4992XD Unspecified injury of left shoulder and upper arm, subsequent encounter: Secondary | ICD-10-CM | POA: Diagnosis not present

## 2015-03-17 DIAGNOSIS — S42295D Other nondisplaced fracture of upper end of left humerus, subsequent encounter for fracture with routine healing: Secondary | ICD-10-CM | POA: Diagnosis not present

## 2015-04-20 ENCOUNTER — Encounter: Payer: Self-pay | Admitting: Cardiovascular Disease

## 2015-04-20 ENCOUNTER — Ambulatory Visit (INDEPENDENT_AMBULATORY_CARE_PROVIDER_SITE_OTHER): Payer: Medicare Other | Admitting: Cardiovascular Disease

## 2015-04-20 VITALS — BP 126/62 | HR 60 | Ht 64.0 in | Wt 114.4 lb

## 2015-04-20 DIAGNOSIS — E785 Hyperlipidemia, unspecified: Secondary | ICD-10-CM | POA: Diagnosis not present

## 2015-04-20 DIAGNOSIS — I2510 Atherosclerotic heart disease of native coronary artery without angina pectoris: Secondary | ICD-10-CM | POA: Diagnosis not present

## 2015-04-20 DIAGNOSIS — I6523 Occlusion and stenosis of bilateral carotid arteries: Secondary | ICD-10-CM | POA: Diagnosis not present

## 2015-04-20 NOTE — Progress Notes (Signed)
Cardiology Office Note   Date:  04/20/2015   ID:  [name], DOB 29-Apr-1924, MRN [id number]  PCP:  Nyoka Cowden, MD  Cardiologist:   Thayer Headings, MD   Chief Complaint  Patient presents with  . Coronary Artery Disease   1. CAD 2. Abdominal Aortic aneurism 3. Hyperlipidemia 4.  Carotid artery disease  History of Present Illness:  She has no complaints. She did not take her meds this am. She stopped smoking several months ago. As a result she's gaining a little bit of weight. She has seen Dr. Bridgett Larsson for further evaluation of her abdominal aortic aneurysm. She has been getting follow up exams. She has not needed surgery for aneurysm yet.  December 26, 2012:  [name] is doing well. No CP. She has an AAA that is being observed.   December 27, 2013:  [name] is doing ok from a cardiac standpoint. She is losing her eyesight. No , no dyspnea. Her AAA is slowly enlarging. Followed by Dr. Bridgett Larsson.   October 21, 2014:  [name] is a 81 y.o. female who presents for follow-up of her coronary artery disease.  She had a stroke several months ago.   No CP or dyspnea.    Sept. 26, 2016:  [name] and broke her arm a month ago. Doing well from a cardiac standpoint.   Past Medical History  Diagnosis Date  . DJD (degenerative joint disease)   . CAD (coronary artery disease)   . Hypertension   . Hyperlipidemia   . Abdominal aortic aneurysm   . Osteopenia   . Aortic insufficiency   . Broken hip     left  . Carotid artery occlusion   . Stroke 2013  and  Feb. 2016    Past Surgical History  Procedure Laterality Date  . Cataract extraction    . Abdominal hysterectomy    . Cholecystectomy    . Tonsillectomy    . Ptca    . Appendectomy    . Cardiac catheterization  2000    PTCA,and stenting to RCA  . Hip fracture surgery      Left hip ORIF  . Fracture surgery Left   . Eye surgery       Current Outpatient Prescriptions  Medication Sig Dispense Refill    . amLODipine (NORVASC) 10 MG tablet TAKE ONE TABLET BY MOUTH ONCE DAILY 30 tablet 6  . clopidogrel (PLAVIX) 75 MG tablet Take 1 tablet (75 mg total) by mouth daily. 31 tablet 11  . metoprolol tartrate (LOPRESSOR) 25 MG tablet Take 1 tablet (25 mg total) by mouth 2 (two) times daily. 180 tablet 2  . Multiple Vitamins-Minerals (PRESERVISION AREDS 2 PO) Take by mouth 2 (two) times daily.    . nitroGLYCERIN (NITROSTAT) 0.4 MG SL tablet Place 1 tablet (0.4 mg total) under the tongue every 5 (five) minutes as needed. 25 tablet 11  . oxyCODONE (ROXICODONE) 5 MG immediate release tablet Take 0.5 tablets (2.5 mg total) by mouth every 4 (four) hours as needed for severe pain. 10 tablet 0  . pravastatin (PRAVACHOL) 40 MG tablet TAKE ONE TABLET BY MOUTH ONCE DAILY 30 tablet 6   No current facility-administered medications for this visit.    Allergies:   Alendronate sodium; Chlordiazepoxide; Librium; and Zocor    Social History:  The patient  reports that she quit smoking about 4 years ago. Her smoking use included Cigarettes. She quit after 50 years of use. She has never used smokeless  tobacco. She reports that she does not drink alcohol or use illicit drugs.   Family History:  The patient's family history includes Cancer in her sister; Coronary artery disease in her brother and mother; Dementia in her sister; Heart disease in her mother and sister.    ROS:  Please see the history of present illness.    Review of Systems: Constitutional:  denies fever, chills, diaphoresis, appetite change and fatigue.  HEENT: denies photophobia, eye pain, redness, hearing loss, ear pain, congestion, sore throat, rhinorrhea, sneezing, neck pain, neck stiffness and tinnitus.  Respiratory: denies SOB, DOE, cough, chest tightness, and wheezing.  Cardiovascular: denies chest pain, palpitations and leg swelling.  Gastrointestinal: denies nausea, vomiting, abdominal pain, diarrhea, constipation, blood in stool.   Genitourinary: denies dysuria, urgency, frequency, hematuria, flank pain and difficulty urinating.  Musculoskeletal: denies  myalgias, back pain, joint swelling, arthralgias and gait problem.   Skin: denies pallor, rash and wound.  Neurological: denies dizziness, seizures, syncope, weakness, light-headedness, numbness and headaches.   Hematological: denies adenopathy, easy bruising, personal or family bleeding history.  Psychiatric/ Behavioral: denies suicidal ideation, mood changes, confusion, nervousness, sleep disturbance and agitation.       All other systems are reviewed and negative.    PHYSICAL EXAM: VS:  BP 126/62 mmHg  Pulse 60  Ht 5\' 4"  (1.626 m)  Wt 51.891 kg (114 lb 6.4 oz)  BMI 19.63 kg/m2  SpO2 96% , BMI Body mass index is 19.63 kg/(m^2). GEN: Well nourished, well developed, in no acute distress HEENT: normal Neck: no JVD, carotid bruits, or masses Cardiac: RRR; no murmurs, rubs,  No edema  Respiratory:  clear to auscultation bilaterally, normal work of breathing GI: soft, , pulsitile midline mass  MS: no deformity or atrophy Skin: warm and dry, no rash Neuro:  Strength and sensation are intact Psych: normal   EKG:  EKG is not ordered today.    Recent Labs: 08/11/2014: ALT 10 03/01/2015: BUN 21*; Creatinine, Ser 1.36*; Hemoglobin 11.8*; Platelets 188; Potassium 4.1; Sodium 137    Lipid Panel    Component Value Date/Time   CHOL 141 08/12/2014 0541   TRIG 76 08/12/2014 0541   HDL 51 08/12/2014 0541   CHOLHDL 2.8 08/12/2014 0541   VLDL 15 08/12/2014 0541   LDLCALC 75 08/12/2014 0541      Wt Readings from Last 3 Encounters:  04/20/15 51.891 kg (114 lb 6.4 oz)  11/28/14 53.071 kg (117 lb)  11/21/14 52.164 kg (115 lb)      Other studies Reviewed: Additional studies/ records that were reviewed today include: . Review of the above records demonstrates:    ASSESSMENT AND PLAN:  1. CAD -  she denies any episodes of chest discomfort.  Overall doing  well   2. Abdominal Aortic aneurism - her abdominal aortic aneurysm is palpable.  Has decided not to have surgery .  Does not plan on following up with Dr. Bridgett Larsson since she is not going to have surgery   3. Hyperlipidemia -  Stable, will check lipids , liver enz. And BMP in 6 months at her next appt.   4.  Carotid artery disease  - she status post recent stroke. Continue current medications.  Current medicines are reviewed at length with the patient today.  The patient does not have concerns regarding medicines.  The following changes have been made:  no change  Labs/ tests ordered today include:  No orders of the defined types were placed in this encounter.  Disposition:   FU with me in  6 months    Signed, Nahser, Wonda Cheng, MD  04/20/2015 2:11 PM    [facility] [address], [place], [place]  [address] Phone: [phone]; Fax: [phone]

## 2015-04-20 NOTE — Patient Instructions (Signed)

## 2015-04-28 ENCOUNTER — Other Ambulatory Visit: Payer: Self-pay | Admitting: Cardiovascular Disease

## 2015-05-01 ENCOUNTER — Ambulatory Visit: Payer: Medicare Other | Admitting: Family

## 2015-05-01 ENCOUNTER — Encounter (HOSPITAL_COMMUNITY): Payer: Medicare Other

## 2015-05-28 ENCOUNTER — Other Ambulatory Visit: Payer: Self-pay | Admitting: Cardiovascular Disease

## 2015-07-08 DIAGNOSIS — Z23 Encounter for immunization: Secondary | ICD-10-CM | POA: Diagnosis not present

## 2015-09-14 ENCOUNTER — Other Ambulatory Visit: Payer: Self-pay | Admitting: Cardiovascular Disease

## 2015-10-09 ENCOUNTER — Other Ambulatory Visit: Payer: Self-pay | Admitting: *Deleted

## 2015-10-09 ENCOUNTER — Telehealth: Payer: Self-pay | Admitting: Cardiovascular Disease

## 2015-10-09 MED ORDER — CLOPIDOGREL BISULFATE 75 MG PO TABS: 75.0000 mg | ORAL_TABLET | Freq: Every day | ORAL | Status: DC

## 2015-10-09 NOTE — Telephone Encounter (Signed)
°*  STAT* If patient is at the pharmacy, call can be transferred to refill team.   1. Which medications need to be refilled? (please list name of each medication and dose if known) generic Plavix 75 mg-pt have been trying to get this since Sunday-She is completely out,she needs this today if possible please.  2. Which pharmacy/location (including street and city if local pharmacy) is medication to be sent to?[phone] 3. Do they need a 30 day or 90 day supply? 30 and refills

## 2015-10-09 NOTE — Telephone Encounter (Signed)
Rx sent to pharmacy. Tried to call patient and also her daughter but both lines rang busy. Patients cell phone went directly to an unidentified voicemail.

## 2015-10-16 ENCOUNTER — Other Ambulatory Visit (INDEPENDENT_AMBULATORY_CARE_PROVIDER_SITE_OTHER): Payer: Medicare Other

## 2015-10-16 DIAGNOSIS — E785 Hyperlipidemia, unspecified: Secondary | ICD-10-CM

## 2015-10-16 DIAGNOSIS — I2510 Atherosclerotic heart disease of native coronary artery without angina pectoris: Secondary | ICD-10-CM

## 2015-10-16 NOTE — Addendum Note (Signed)
Addended by: Stephannie Peters on: 10/16/2015 09:08 AM   Modules accepted: Orders

## 2015-10-20 ENCOUNTER — Ambulatory Visit (INDEPENDENT_AMBULATORY_CARE_PROVIDER_SITE_OTHER): Payer: Medicare Other | Admitting: Cardiovascular Disease

## 2015-10-20 ENCOUNTER — Encounter: Payer: Self-pay | Admitting: Cardiovascular Disease

## 2015-10-20 VITALS — BP 128/58 | HR 84 | Ht 64.0 in | Wt 116.0 lb

## 2015-10-20 DIAGNOSIS — E785 Hyperlipidemia, unspecified: Secondary | ICD-10-CM

## 2015-10-20 DIAGNOSIS — I2510 Atherosclerotic heart disease of native coronary artery without angina pectoris: Secondary | ICD-10-CM | POA: Diagnosis not present

## 2015-10-20 MED ORDER — NITROGLYCERIN 0.4 MG SL SUBL: 0.4000 mg | SUBLINGUAL_TABLET | SUBLINGUAL | Status: AC | PRN

## 2015-10-20 MED ORDER — METOPROLOL TARTRATE 25 MG PO TABS: 25.0000 mg | ORAL_TABLET | Freq: Two times a day (BID) | ORAL | Status: AC

## 2015-10-20 MED ORDER — CLOPIDOGREL BISULFATE 75 MG PO TABS: 75.0000 mg | ORAL_TABLET | Freq: Every day | ORAL | Status: AC

## 2015-10-20 MED ORDER — PRAVASTATIN SODIUM 40 MG PO TABS: 40.0000 mg | ORAL_TABLET | Freq: Every day | ORAL | Status: AC

## 2015-10-20 NOTE — Patient Instructions (Signed)

## 2015-10-20 NOTE — Progress Notes (Signed)
Cardiology Office Note   Date:  10/20/2015   ID:  [name], DOB [birth year], MRN [id number]  PCP:  Nyoka Cowden, MD  Cardiologist:   Thayer Headings, MD   Chief Complaint  Patient presents with  . Coronary Artery Disease   1. CAD 2. Abdominal Aortic aneurism 3. Hyperlipidemia 4.  Carotid artery disease  History of Present Illness:  She has no complaints. She did not take her meds this am. She stopped smoking several months ago. As a result she's gaining a little bit of weight. She has seen Dr. Bridgett Larsson for further evaluation of her abdominal aortic aneurysm. She has been getting follow up exams. She has not needed surgery for aneurysm yet.  December 26, 2012:  [name] is doing well. No CP. She has an AAA that is being observed.   December 27, 2013:  [name] is doing ok from a cardiac standpoint. She is losing her eyesight. No , no dyspnea. Her AAA is slowly enlarging. Followed by Dr. Bridgett Larsson.   October 21, 2014:  [name] is a 82 y.o. female who presents for follow-up of her coronary artery disease.  She had a stroke several months ago.   No CP or dyspnea.    Sept. 26, 2016:  [name] and broke her arm a month ago. Doing well from a cardiac standpoint.   Past Medical History  Diagnosis Date  . DJD (degenerative joint disease)   . CAD (coronary artery disease)   . Hypertension   . Hyperlipidemia   . Abdominal aortic aneurysm ([place])   . Osteopenia   . Aortic insufficiency   . Broken hip ([place])     left  . Carotid artery occlusion   . Stroke [facility]) 2013  and  Feb. 2016    Past Surgical History  Procedure Laterality Date  . Cataract extraction    . Abdominal hysterectomy    . Cholecystectomy    . Tonsillectomy    . Ptca    . Appendectomy    . Cardiac catheterization  2000    PTCA,and stenting to RCA  . Hip fracture surgery      Left hip ORIF  . Fracture surgery Left   . Eye surgery       Current Outpatient Prescriptions  Medication Sig  Dispense Refill  . amLODipine (NORVASC) 10 MG tablet TAKE ONE TABLET BY MOUTH ONCE DAILY 30 tablet 11  . clopidogrel (PLAVIX) 75 MG tablet Take 1 tablet (75 mg total) by mouth daily. 30 tablet 5  . metoprolol tartrate (LOPRESSOR) 25 MG tablet TAKE ONE TABLET BY MOUTH TWICE DAILY 180 tablet 0  . Multiple Vitamins-Minerals (PRESERVISION AREDS 2 PO) Take by mouth 2 (two) times daily.    . nitroGLYCERIN (NITROSTAT) 0.4 MG SL tablet Place 1 tablet (0.4 mg total) under the tongue every 5 (five) minutes as needed. 25 tablet 11  . oxyCODONE (ROXICODONE) 5 MG immediate release tablet Take 0.5 tablets (2.5 mg total) by mouth every 4 (four) hours as needed for severe pain. 10 tablet 0  . pravastatin (PRAVACHOL) 40 MG tablet TAKE ONE TABLET BY MOUTH ONCE DAILY 30 tablet 9   No current facility-administered medications for this visit.    Allergies:   Alendronate sodium; Chlordiazepoxide; Librium; and Zocor    Social History:  The patient  reports that she quit smoking about 4 years ago. Her smoking use included Cigarettes. She quit after 50 years of use. She has never used smokeless tobacco. She reports  that she does not drink alcohol or use illicit drugs.   Family History:  The patient's family history includes Cancer in her sister; Coronary artery disease in her brother and mother; Dementia in her sister; Heart disease in her mother and sister.    ROS:  Please see the history of present illness.    Review of Systems: Constitutional:  denies fever, chills, diaphoresis, appetite change and fatigue.  HEENT: denies photophobia, eye pain, redness, hearing loss, ear pain, congestion, sore throat, rhinorrhea, sneezing, neck pain, neck stiffness and tinnitus.  Respiratory: denies SOB, DOE, cough, chest tightness, and wheezing.  Cardiovascular: denies chest pain, palpitations and leg swelling.  Gastrointestinal: denies nausea, vomiting, abdominal pain, diarrhea, constipation, blood in stool.  Genitourinary:  denies dysuria, urgency, frequency, hematuria, flank pain and difficulty urinating.  Musculoskeletal: denies  myalgias, back pain, joint swelling, arthralgias and gait problem.   Skin: denies pallor, rash and wound.  Neurological: denies dizziness, seizures, syncope, weakness, light-headedness, numbness and headaches.   Hematological: denies adenopathy, easy bruising, personal or family bleeding history.  Psychiatric/ Behavioral: denies suicidal ideation, mood changes, confusion, nervousness, sleep disturbance and agitation.       All other systems are reviewed and negative.    PHYSICAL EXAM: VS:  BP 128/58 mmHg  Pulse 84  Ht 5\' 4"  (1.626 m)  Wt 116 lb (52.617 kg)  BMI 19.90 kg/m2  SpO2 96% , BMI Body mass index is 19.9 kg/(m^2). GEN: Well nourished, well developed, in no acute distress HEENT: normal Neck: no JVD, carotid bruits, or masses Cardiac: RRR; no murmurs, rubs,  No edema  Respiratory:  clear to auscultation bilaterally, normal work of breathing GI: soft, , large pulsitile midline mass , + briut  MS: no deformity or atrophy Skin: warm and dry, no rash Neuro:  Strength and sensation are intact Psych: normal   EKG:  EKG is not ordered today.    Recent Labs: 03/01/2015: Hemoglobin 11.8*; Platelets 188 10/16/2015: ALT 12; BUN 22; Creat 1.35*; Potassium 5.3; Sodium 138    Lipid Panel    Component Value Date/Time   CHOL 134 10/16/2015 0908   TRIG 385* 10/16/2015 0908   HDL 58 10/16/2015 0908   CHOLHDL 2.3 10/16/2015 0908   VLDL 77* 10/16/2015 0908   LDLCALC NOT CALC 10/16/2015 0908      Wt Readings from Last 3 Encounters:  10/20/15 116 lb (52.617 kg)  04/20/15 114 lb 6.4 oz (51.891 kg)  11/28/14 117 lb (53.071 kg)      Other studies Reviewed: Additional studies/ records that were reviewed today include: . Review of the above records demonstrates:    ASSESSMENT AND PLAN:  1. CAD -  she denies any episodes of chest discomfort.  Overall doing well  . She is 82 yo.  No plans for additional testing or procedure   2. Abdominal Aortic aneurism - her abdominal aortic aneurysm is palpable.  Has decided not to have surgery .  Does not plan on following up with Dr. Bridgett Larsson since she is not going to have surgery   3. Hyperlipidemia -  Trigs are elevated.   She eats fried chicken nuggets every day along with potato chips . Advised her to back off the fried foods.    4.  Carotid artery disease  - she status post recent stroke. Continue current medications.  Current medicines are reviewed at length with the patient today.  The patient does not have concerns regarding medicines.  The following changes have been made:  no change  Labs/ tests ordered today include:  No orders of the defined types were placed in this encounter.     Disposition:   FU with me in  6 months    Signed, Masami Plata, Wonda Cheng, MD  10/20/2015 11:40 AM    [facility] [place], [place], [place]  [address] Phone: [phone]; Fax: [phone]

## 2015-10-21 LAB — BASIC METABOLIC PANEL
BUN: 22 mg/dL (ref 7–25)
CO2: 26 mmol/L (ref 20–31)
CREATININE: 1.35 mg/dL — AB (ref 0.60–0.88)
Calcium: 9.3 mg/dL (ref 8.6–10.4)
Chloride: 106 mmol/L (ref 98–110)
GLUCOSE: 94 mg/dL (ref 65–99)
POTASSIUM: 5.3 mmol/L (ref 3.5–5.3)
Sodium: 138 mmol/L (ref 135–146)

## 2015-10-21 LAB — HEPATIC FUNCTION PANEL
ALBUMIN: 3.9 g/dL (ref 3.6–5.1)
ALT: 12 U/L (ref 6–29)
AST: 16 U/L (ref 10–35)
Alkaline Phosphatase: 83 U/L (ref 33–130)
BILIRUBIN DIRECT: 0.1 mg/dL (ref <=0.2)
Indirect Bilirubin: 0.3 mg/dL (ref 0.2–1.2)
Total Bilirubin: 0.4 mg/dL (ref 0.2–1.2)
Total Protein: 6.9 g/dL (ref 6.1–8.1)

## 2015-10-21 LAB — LIPID PANEL
CHOLESTEROL: 134 mg/dL (ref 125–200)
HDL: 58 mg/dL (ref >=46)
LDL Cholesterol: 56 mg/dL (ref <130)
TRIGLYCERIDES: 101 mg/dL (ref <150)
Total CHOL/HDL Ratio: 2.3 Ratio (ref <=5.0)
VLDL: 20 mg/dL (ref <30)

## 2015-12-11 DIAGNOSIS — C44311 Basal cell carcinoma of skin of nose: Secondary | ICD-10-CM | POA: Diagnosis not present

## 2015-12-11 DIAGNOSIS — D485 Neoplasm of uncertain behavior of skin: Secondary | ICD-10-CM | POA: Diagnosis not present

## 2015-12-11 DIAGNOSIS — L309 Dermatitis, unspecified: Secondary | ICD-10-CM | POA: Diagnosis not present

## 2015-12-11 DIAGNOSIS — L821 Other seborrheic keratosis: Secondary | ICD-10-CM | POA: Diagnosis not present

## 2015-12-11 DIAGNOSIS — L820 Inflamed seborrheic keratosis: Secondary | ICD-10-CM | POA: Diagnosis not present

## 2015-12-11 DIAGNOSIS — D0439 Carcinoma in situ of skin of other parts of face: Secondary | ICD-10-CM | POA: Diagnosis not present

## 2016-05-16 ENCOUNTER — Other Ambulatory Visit: Payer: Self-pay | Admitting: *Deleted

## 2016-05-16 MED ORDER — AMLODIPINE BESYLATE 10 MG PO TABS: 10.0000 mg | ORAL_TABLET | Freq: Every day | ORAL | 1 refills | Status: AC

## 2016-07-28 ENCOUNTER — Encounter (HOSPITAL_COMMUNITY): Payer: Self-pay | Admitting: Emergency Medicine

## 2016-07-28 ENCOUNTER — Emergency Department (HOSPITAL_COMMUNITY): Payer: Medicare Other

## 2016-07-28 ENCOUNTER — Inpatient Hospital Stay (HOSPITAL_COMMUNITY)
Admission: EM | Admit: 2016-07-28 | Discharge: 2016-08-25 | DRG: 065 | Disposition: E | Payer: Medicare Other | Attending: Family Medicine | Admitting: Family Medicine

## 2016-07-28 DIAGNOSIS — I10 Essential (primary) hypertension: Secondary | ICD-10-CM | POA: Diagnosis not present

## 2016-07-28 DIAGNOSIS — R29810 Facial weakness: Secondary | ICD-10-CM | POA: Diagnosis present

## 2016-07-28 DIAGNOSIS — I351 Nonrheumatic aortic (valve) insufficiency: Secondary | ICD-10-CM | POA: Diagnosis present

## 2016-07-28 DIAGNOSIS — M1990 Unspecified osteoarthritis, unspecified site: Secondary | ICD-10-CM | POA: Diagnosis present

## 2016-07-28 DIAGNOSIS — N184 Chronic kidney disease, stage 4 (severe): Secondary | ICD-10-CM | POA: Diagnosis present

## 2016-07-28 DIAGNOSIS — Z6820 Body mass index (BMI) 20.0-20.9, adult: Secondary | ICD-10-CM

## 2016-07-28 DIAGNOSIS — Z87891 Personal history of nicotine dependence: Secondary | ICD-10-CM

## 2016-07-28 DIAGNOSIS — I619 Nontraumatic intracerebral hemorrhage, unspecified: Secondary | ICD-10-CM | POA: Diagnosis present

## 2016-07-28 DIAGNOSIS — G8194 Hemiplegia, unspecified affecting left nondominant side: Secondary | ICD-10-CM | POA: Diagnosis present

## 2016-07-28 DIAGNOSIS — I611 Nontraumatic intracerebral hemorrhage in hemisphere, cortical: Secondary | ICD-10-CM

## 2016-07-28 DIAGNOSIS — I2510 Atherosclerotic heart disease of native coronary artery without angina pectoris: Secondary | ICD-10-CM | POA: Diagnosis present

## 2016-07-28 DIAGNOSIS — Z515 Encounter for palliative care: Secondary | ICD-10-CM | POA: Diagnosis not present

## 2016-07-28 DIAGNOSIS — Z8249 Family history of ischemic heart disease and other diseases of the circulatory system: Secondary | ICD-10-CM

## 2016-07-28 DIAGNOSIS — I6529 Occlusion and stenosis of unspecified carotid artery: Secondary | ICD-10-CM | POA: Diagnosis present

## 2016-07-28 DIAGNOSIS — Z8673 Personal history of transient ischemic attack (TIA), and cerebral infarction without residual deficits: Secondary | ICD-10-CM

## 2016-07-28 DIAGNOSIS — Z8679 Personal history of other diseases of the circulatory system: Secondary | ICD-10-CM

## 2016-07-28 DIAGNOSIS — H53462 Homonymous bilateral field defects, left side: Secondary | ICD-10-CM | POA: Diagnosis present

## 2016-07-28 DIAGNOSIS — S06369A Traumatic hemorrhage of cerebrum, unspecified, with loss of consciousness of unspecified duration, initial encounter: Secondary | ICD-10-CM | POA: Diagnosis not present

## 2016-07-28 DIAGNOSIS — Z7902 Long term (current) use of antithrombotics/antiplatelets: Secondary | ICD-10-CM

## 2016-07-28 DIAGNOSIS — R4781 Slurred speech: Secondary | ICD-10-CM | POA: Diagnosis not present

## 2016-07-28 DIAGNOSIS — Z79891 Long term (current) use of opiate analgesic: Secondary | ICD-10-CM

## 2016-07-28 DIAGNOSIS — I714 Abdominal aortic aneurysm, without rupture: Secondary | ICD-10-CM | POA: Diagnosis present

## 2016-07-28 DIAGNOSIS — Z955 Presence of coronary angioplasty implant and graft: Secondary | ICD-10-CM

## 2016-07-28 DIAGNOSIS — Z9849 Cataract extraction status, unspecified eye: Secondary | ICD-10-CM

## 2016-07-28 DIAGNOSIS — I129 Hypertensive chronic kidney disease with stage 1 through stage 4 chronic kidney disease, or unspecified chronic kidney disease: Secondary | ICD-10-CM | POA: Diagnosis present

## 2016-07-28 DIAGNOSIS — E785 Hyperlipidemia, unspecified: Secondary | ICD-10-CM | POA: Diagnosis present

## 2016-07-28 DIAGNOSIS — R64 Cachexia: Secondary | ICD-10-CM | POA: Diagnosis present

## 2016-07-28 DIAGNOSIS — I6789 Other cerebrovascular disease: Secondary | ICD-10-CM | POA: Diagnosis not present

## 2016-07-28 DIAGNOSIS — Z66 Do not resuscitate: Secondary | ICD-10-CM | POA: Diagnosis present

## 2016-07-28 DIAGNOSIS — M8580 Other specified disorders of bone density and structure, unspecified site: Secondary | ICD-10-CM | POA: Diagnosis present

## 2016-07-28 DIAGNOSIS — R29818 Other symptoms and signs involving the nervous system: Secondary | ICD-10-CM | POA: Diagnosis not present

## 2016-07-28 DIAGNOSIS — R4182 Altered mental status, unspecified: Secondary | ICD-10-CM | POA: Diagnosis not present

## 2016-07-28 DIAGNOSIS — Z8041 Family history of malignant neoplasm of ovary: Secondary | ICD-10-CM

## 2016-07-28 DIAGNOSIS — R0603 Acute respiratory distress: Secondary | ICD-10-CM | POA: Diagnosis present

## 2016-07-28 DIAGNOSIS — I6011 Nontraumatic subarachnoid hemorrhage from right middle cerebral artery: Secondary | ICD-10-CM | POA: Diagnosis present

## 2016-07-28 DIAGNOSIS — Z79899 Other long term (current) drug therapy: Secondary | ICD-10-CM | POA: Diagnosis not present

## 2016-07-28 DIAGNOSIS — Z888 Allergy status to other drugs, medicaments and biological substances status: Secondary | ICD-10-CM | POA: Diagnosis not present

## 2016-07-28 DIAGNOSIS — W19XXXA Unspecified fall, initial encounter: Secondary | ICD-10-CM | POA: Diagnosis present

## 2016-07-28 DIAGNOSIS — I615 Nontraumatic intracerebral hemorrhage, intraventricular: Principal | ICD-10-CM | POA: Diagnosis present

## 2016-07-28 LAB — COMPREHENSIVE METABOLIC PANEL
ALT: 23 U/L (ref 14–54)
ANION GAP: 11 (ref 5–15)
AST: 24 U/L (ref 15–41)
Albumin: 3.6 g/dL (ref 3.5–5.0)
Alkaline Phosphatase: 72 U/L (ref 38–126)
BILIRUBIN TOTAL: 0.6 mg/dL (ref 0.3–1.2)
BUN: 24 mg/dL — ABNORMAL HIGH (ref 6–20)
CO2: 19 mmol/L — ABNORMAL LOW (ref 22–32)
Calcium: 8.8 mg/dL — ABNORMAL LOW (ref 8.9–10.3)
Chloride: 105 mmol/L (ref 101–111)
Creatinine, Ser: 1.67 mg/dL — ABNORMAL HIGH (ref 0.44–1.00)
GFR calc Af Amer: 30 mL/min — ABNORMAL LOW (ref >60)
GFR, EST NON AFRICAN AMERICAN: 26 mL/min — AB (ref >60)
Glucose, Bld: 169 mg/dL — ABNORMAL HIGH (ref 65–99)
POTASSIUM: 4 mmol/L (ref 3.5–5.1)
SODIUM: 135 mmol/L (ref 135–145)
TOTAL PROTEIN: 6.6 g/dL (ref 6.5–8.1)

## 2016-07-28 LAB — CBG MONITORING, ED: GLUCOSE-CAPILLARY: 179 mg/dL — AB (ref 65–99)

## 2016-07-28 LAB — I-STAT CHEM 8, ED
BUN: 25 mg/dL — AB (ref 6–20)
CREATININE: 1.6 mg/dL — AB (ref 0.44–1.00)
Calcium, Ion: 1.13 mmol/L — ABNORMAL LOW (ref 1.15–1.40)
Chloride: 104 mmol/L (ref 101–111)
GLUCOSE: 163 mg/dL — AB (ref 65–99)
HCT: 34 % — ABNORMAL LOW (ref 36.0–46.0)
Hemoglobin: 11.6 g/dL — ABNORMAL LOW (ref 12.0–15.0)
POTASSIUM: 4 mmol/L (ref 3.5–5.1)
Sodium: 137 mmol/L (ref 135–145)
TCO2: 21 mmol/L (ref 0–100)

## 2016-07-28 LAB — DIFFERENTIAL
Basophils Absolute: 0 10*3/uL (ref 0.0–0.1)
Basophils Relative: 0 %
EOS ABS: 0.2 10*3/uL (ref 0.0–0.7)
Eosinophils Relative: 1 %
LYMPHS PCT: 4 %
Lymphs Abs: 0.7 10*3/uL (ref 0.7–4.0)
MONOS PCT: 8 %
Monocytes Absolute: 1.3 10*3/uL — ABNORMAL HIGH (ref 0.1–1.0)
NEUTROS PCT: 87 %
Neutro Abs: 14.6 10*3/uL — ABNORMAL HIGH (ref 1.7–7.7)

## 2016-07-28 LAB — CBC
HCT: 34.1 % — ABNORMAL LOW (ref 36.0–46.0)
HEMOGLOBIN: 11.4 g/dL — AB (ref 12.0–15.0)
MCH: 29.7 pg (ref 26.0–34.0)
MCHC: 33.4 g/dL (ref 30.0–36.0)
MCV: 88.8 fL (ref 78.0–100.0)
PLATELETS: 222 10*3/uL (ref 150–400)
RBC: 3.84 MIL/uL — AB (ref 3.87–5.11)
RDW: 14.3 % (ref 11.5–15.5)
WBC: 16.8 10*3/uL — AB (ref 4.0–10.5)

## 2016-07-28 LAB — I-STAT TROPONIN, ED: TROPONIN I, POC: 0.01 ng/mL (ref 0.00–0.08)

## 2016-07-28 LAB — PROTIME-INR
INR: 1.12
PROTHROMBIN TIME: 14.5 s (ref 11.4–15.2)

## 2016-07-28 LAB — APTT: aPTT: 26 seconds (ref 24–36)

## 2016-07-28 MED ORDER — LABETALOL HCL 5 MG/ML IV SOLN: 20.0000 mg | Freq: Once | INTRAVENOUS | Status: DC

## 2016-07-28 MED ORDER — LORAZEPAM 2 MG/ML PO CONC: 1.0000 mg | ORAL | Status: DC | PRN

## 2016-07-28 MED ORDER — GLYCOPYRROLATE 0.2 MG/ML IJ SOLN
0.2000 mg | INTRAMUSCULAR | Status: DC | PRN
  Administered 2016-07-29: 0.2 mg via INTRAVENOUS
  Filled 2016-07-28: qty 1

## 2016-07-28 MED ORDER — ONDANSETRON HCL 4 MG/2ML IJ SOLN: 4.0000 mg | Freq: Four times a day (QID) | INTRAMUSCULAR | Status: DC | PRN

## 2016-07-28 MED ORDER — CLEVIDIPINE BUTYRATE 0.5 MG/ML IV EMUL: 0.0000 mg/h | INTRAVENOUS | Status: DC

## 2016-07-28 MED ORDER — GLYCOPYRROLATE 0.2 MG/ML IJ SOLN: 0.2000 mg | INTRAMUSCULAR | Status: DC | PRN

## 2016-07-28 MED ORDER — PANTOPRAZOLE SODIUM 40 MG IV SOLR: 40.0000 mg | Freq: Every day | INTRAVENOUS | Status: DC

## 2016-07-28 MED ORDER — ACETAMINOPHEN 650 MG RE SUPP: 650.0000 mg | Freq: Four times a day (QID) | RECTAL | Status: DC | PRN

## 2016-07-28 MED ORDER — HALOPERIDOL 1 MG PO TABS: 0.5000 mg | ORAL_TABLET | ORAL | Status: DC | PRN

## 2016-07-28 MED ORDER — IPRATROPIUM-ALBUTEROL 0.5-2.5 (3) MG/3ML IN SOLN: 3.0000 mL | RESPIRATORY_TRACT | Status: DC | PRN

## 2016-07-28 MED ORDER — IPRATROPIUM-ALBUTEROL 0.5-2.5 (3) MG/3ML IN SOLN
3.0000 mL | Freq: Four times a day (QID) | RESPIRATORY_TRACT | Status: DC
Start: 2016-07-28 — End: 2016-07-28
  Administered 2016-07-28: 3 mL via RESPIRATORY_TRACT

## 2016-07-28 MED ORDER — SODIUM CHLORIDE 0.9 % IV SOLN: INTRAVENOUS | Status: DC

## 2016-07-28 MED ORDER — ACETAMINOPHEN 650 MG RE SUPP: 650.0000 mg | RECTAL | Status: DC | PRN

## 2016-07-28 MED ORDER — LORAZEPAM 2 MG/ML IJ SOLN
1.0000 mg | INTRAMUSCULAR | Status: DC | PRN
  Administered 2016-07-29 (×3): 1 mg via INTRAVENOUS
  Filled 2016-07-28 (×4): qty 1

## 2016-07-28 MED ORDER — SENNOSIDES-DOCUSATE SODIUM 8.6-50 MG PO TABS: 1.0000 | ORAL_TABLET | Freq: Two times a day (BID) | ORAL | Status: DC

## 2016-07-28 MED ORDER — ACETAMINOPHEN 325 MG PO TABS: 650.0000 mg | ORAL_TABLET | ORAL | Status: DC | PRN

## 2016-07-28 MED ORDER — ONDANSETRON 4 MG PO TBDP: 4.0000 mg | ORAL_TABLET | Freq: Four times a day (QID) | ORAL | Status: DC | PRN

## 2016-07-28 MED ORDER — SODIUM CHLORIDE 0.9 % IV SOLN
INTRAVENOUS | Status: DC
  Administered 2016-07-29 – 2016-07-31 (×2): via INTRAVENOUS

## 2016-07-28 MED ORDER — LORAZEPAM 1 MG PO TABS: 1.0000 mg | ORAL_TABLET | ORAL | Status: DC | PRN

## 2016-07-28 MED ORDER — HALOPERIDOL LACTATE 5 MG/ML IJ SOLN: 0.5000 mg | INTRAMUSCULAR | Status: DC | PRN

## 2016-07-28 MED ORDER — ACETAMINOPHEN 160 MG/5ML PO SOLN
650.0000 mg | ORAL | Status: DC | PRN
Start: 2016-07-28 — End: 2016-07-28

## 2016-07-28 MED ORDER — STROKE: EARLY STAGES OF RECOVERY BOOK
Freq: Once | Status: DC
  Filled 2016-07-28: qty 1

## 2016-07-28 MED ORDER — ACETAMINOPHEN 325 MG PO TABS: 650.0000 mg | ORAL_TABLET | Freq: Four times a day (QID) | ORAL | Status: DC | PRN

## 2016-07-28 MED ORDER — MORPHINE SULFATE (PF) 4 MG/ML IV SOLN
1.0000 mg | INTRAVENOUS | Status: DC | PRN
  Administered 2016-07-29 (×2): 1 mg via INTRAVENOUS
  Filled 2016-07-28 (×2): qty 1

## 2016-07-28 MED ORDER — HALOPERIDOL LACTATE 2 MG/ML PO CONC
0.5000 mg | ORAL | Status: DC | PRN
  Filled 2016-07-28: qty 0.3

## 2016-07-28 MED ORDER — GLYCOPYRROLATE 1 MG PO TABS
1.0000 mg | ORAL_TABLET | ORAL | Status: DC | PRN
  Filled 2016-07-28: qty 1

## 2016-07-28 NOTE — Progress Notes (Signed)
After family arrival, discussed poor prognosis, options for treatment. Both daughter and son agree that she would not want aggressive care and would only want treatments geared towards comfort. I think that this is very appropriate in her situation.   Will ask for hosptilalist admission for palliative care.   Roland Rack, MD [facility] [phone]  If 7pm- 7am, please page neurology on call as listed in [place].

## 2016-07-28 NOTE — ED Triage Notes (Signed)
Patient was with daughter for lunch states was going to get the car and could not move leg and fell. Patient family called EMS patient down and outside for 20 mins. Per EMS on there arrival patient denied any neck or back pain. Per EMS patient started having slurred speech. Left arm became flaccid. And right eye gauze. Patient had right eye guaze on arrival. Patient not moving left leg. RN unable to determine any words. Patient not following commands. Patient has a skin tear on left forearm. brusing noted bilateral arms.

## 2016-07-28 NOTE — ED Notes (Signed)
EKG given to Dr. Campos 

## 2016-07-28 NOTE — H&P (Signed)
History and Physical    [name] [id number] DOB: [birth year] DOA: 08/09/2016  Referring MD/NP/PA: Dr. Nyoka Lint PCP: Nyoka Cowden, MD  Patient coming from:  via EMS  Chief Complaint: Fall  HPI: [name] is a 83 y.o. female with medical history significant of  HTN, HLD, CAD, AAA, CVA; who presents after having a fall. History is obtained per review of records and family present at bedside as she is unable to provide her own history at this time. Last seen normal around 4 PM this afternoon. Patient noted to have upper and lower left-sided weakness, slurred speech, and rightward eye gaze. By the time EMS arrived patient was noted to initially communicate but speech became unrecognizable as time went on.   ED Course: Upon admission to the emergency department patient was evaluated and seen to have a large right parietal intracranial hemorrhage. Initially seen by neurology and family wanted everything to be done initially. However, family change patient's status to comfort care measures only once they arrived at the hospital. Hospitalist asked to admit for comfort care measures only. Discussed with family in detail what comfort care measures entailed and family asked for Foley catheter to be placed.  Review of Systems: Unable to assess due to patient condition.  Past Medical History:  Diagnosis Date  . Abdominal aortic aneurysm ([place])   . Aortic insufficiency   . Broken hip ([place])    left  . CAD (coronary artery disease)   . Carotid artery occlusion   . DJD (degenerative joint disease)   . Hyperlipidemia   . Hypertension   . Osteopenia   . Stroke [facility]) 2013  and  Feb. 2016    Past Surgical History:  Procedure Laterality Date  . ABDOMINAL HYSTERECTOMY    . APPENDECTOMY    . CARDIAC CATHETERIZATION  2000   PTCA,and stenting to RCA  . CATARACT EXTRACTION    . CHOLECYSTECTOMY    . EYE SURGERY    . FRACTURE SURGERY Left   . HIP FRACTURE SURGERY     Left hip ORIF  .  PTCA    . TONSILLECTOMY       reports that she quit smoking about 5 years ago. Her smoking use included Cigarettes. She quit after 50.00 years of use. She has never used smokeless tobacco. She reports that she does not drink alcohol or use drugs.  Allergies  Allergen Reactions  . Alendronate Sodium   . Chlordiazepoxide   . Librium   . Zocor [Simvastatin]     Leg cramps    Family History  Problem Relation Age of Onset  . Coronary artery disease Mother   . Heart disease Mother   . Dementia Sister   . Heart disease Sister   . Cancer Sister     Cervical or Ovarian  . Coronary artery disease Brother     Prior to Admission medications   Medication Sig Start Date End Date Taking? Authorizing Provider  amLODipine (NORVASC) 10 MG tablet Take 1 tablet (10 mg total) by mouth daily. 05/16/16   Thayer Headings, MD  clopidogrel (PLAVIX) 75 MG tablet Take 1 tablet (75 mg total) by mouth daily. 10/20/15   Thayer Headings, MD  metoprolol tartrate (LOPRESSOR) 25 MG tablet Take 1 tablet (25 mg total) by mouth 2 (two) times daily. 10/20/15   Thayer Headings, MD  Multiple Vitamins-Minerals (PRESERVISION AREDS 2 PO) Take by mouth 2 (two) times daily.    Historical Provider, MD  nitroGLYCERIN (NITROSTAT)  0.4 MG SL tablet Place 1 tablet (0.4 mg total) under the tongue every 5 (five) minutes as needed. 10/20/15   Thayer Headings, MD  oxyCODONE (ROXICODONE) 5 MG immediate release tablet Take 0.5 tablets (2.5 mg total) by mouth every 4 (four) hours as needed for severe pain. 03/01/15   Deno Etienne, DO  pravastatin (PRAVACHOL) 40 MG tablet Take 1 tablet (40 mg total) by mouth daily. 10/20/15   Thayer Headings, MD    Physical Exam:    Constitutional: Elderly female who is unable to follow commands Vitals:   08/05/2016 1825 08/06/2016 1830 08/18/2016 1900 08/06/2016 1930  BP:  111/56 118/66 101/85  Pulse: 102 99 103 96  Resp: 22 21 25    Temp:      TempSrc:      SpO2: 96% 97% 96% 99%  Weight:       Eyes:  Rightward gaze, reactive pupils ENMT: Mucous membranes are dryt. Posterior pharynx clear of any exudate or lesions.  Neck: normal, supple, no masses, no thyromegaly Respiratory: Tachypneic with coarse breath sounds and rales present  Cardiovascular: Tachycardic, + SEM, no  rubs / gallops. No extremity edema. 2+ pedal pulses. No carotid bruits.  Abdomen: no tenderness, no masses palpated. No hepatosplenomegaly. Bowel sounds positive.  Musculoskeletal: no clubbing / cyanosis. No joint deformity upper and lower extremities. no contractures. Normal muscle tone.  Skin:  Bruising of the bilateral upper and lower extremities noted  Neurologic: Patient only able to move right side and left side is flaccid  Psychiatric: Unable to assess    Labs on Admission: I have personally reviewed following labs and imaging studies  CBC:  Recent Labs Lab 08/19/2016 1727 07/29/2016 1739  WBC 16.8*  --   NEUTROABS 14.6*  --   HGB 11.4* 11.6*  HCT 34.1* 34.0*  MCV 88.8  --   PLT 222  --    Basic Metabolic Panel:  Recent Labs Lab 08/18/2016 1727 08/09/2016 1739  NA 135 137  K 4.0 4.0  CL 105 104  CO2 19*  --   GLUCOSE 169* 163*  BUN 24* 25*  CREATININE 1.67* 1.60*  CALCIUM 8.8*  --    GFR: CrCl cannot be calculated (Unknown ideal weight.). Liver Function Tests:  Recent Labs Lab 08/19/2016 1727  AST 24  ALT 23  ALKPHOS 72  BILITOT 0.6  PROT 6.6  ALBUMIN 3.6   No results for input(s): LIPASE, AMYLASE in the last 168 hours. No results for input(s): AMMONIA in the last 168 hours. Coagulation Profile:  Recent Labs Lab 08/15/2016 1727  INR 1.12   Cardiac Enzymes: No results for input(s): CKTOTAL, CKMB, CKMBINDEX, TROPONINI in the last 168 hours. BNP (last 3 results) No results for input(s): PROBNP in the last 8760 hours. HbA1C: No results for input(s): HGBA1C in the last 72 hours. CBG:  Recent Labs Lab 08/10/2016 1741  GLUCAP 179*   Lipid Profile: No results for input(s): CHOL, HDL,  LDLCALC, TRIG, CHOLHDL, LDLDIRECT in the last 72 hours. Thyroid Function Tests: No results for input(s): TSH, T4TOTAL, FREET4, T3FREE, THYROIDAB in the last 72 hours. Anemia Panel: No results for input(s): VITAMINB12, FOLATE, FERRITIN, TIBC, IRON, RETICCTPCT in the last 72 hours. Urine analysis:    Component Value Date/Time   COLORURINE YELLOW 03/01/2015 0730   APPEARANCEUR CLOUDY (A) 03/01/2015 0730   LABSPEC 1.028 03/01/2015 0730   PHURINE 5.5 03/01/2015 0730   GLUCOSEU NEGATIVE 03/01/2015 0730   HGBUR NEGATIVE 03/01/2015 0730   HGBUR negative 01/15/2009  [place] 03/01/2015 0730   KETONESUR NEGATIVE 03/01/2015 0730   PROTEINUR 30 (A) 03/01/2015 0730   UROBILINOGEN 0.2 03/01/2015 0730   NITRITE NEGATIVE 03/01/2015 0730   LEUKOCYTESUR MODERATE (A) 03/01/2015 0730   Sepsis Labs: No results found for this or any previous visit (from the past 240 hour(s)).   Radiological Exams on Admission: Ct Head Code Stroke W/o Cm  Result Date: 08/13/2016 CLINICAL DATA:  Code stroke. 83 year old female last seen normal at 1650 hours. The patient is known to have fallen since that time. Initial encounter. EXAM: CT HEAD WITHOUT CONTRAST TECHNIQUE: Contiguous axial images were obtained from the base of the skull through the vertex without intravenous contrast. COMPARISON:  Head and cervical spine CT 03/01/2015. Brain MRI 08/11/2014, and earlier. FINDINGS: Brain: Large intra-axial hemorrhage in the posterior right frontal and right parietal lobes. Hyperdense blood products encompass 69 x 53 x 54 mm (AP by transverse by CC), for an estimated intra-axial blood volume of 99 mL. Interestingly, some of the hemorrhage distribution is gyriform particularly along what appears to be the central sulcus (series 21, image 25). There is surrounding edema. There is regional mass effect which is fairly mild in part owing to the underlying generalized cerebral volume loss. No midline shift at this time. No  loss of basilar cisterns. There is a moderate volume of intraventricular extension of hemorrhage into the right lateral ventricle. The other ventricles are spared at this time. No ventriculomegaly. There are multiple small foci of superimposed subarachnoid hemorrhage, but most of these are over the contralateral left superior convexity. There also appears to be a small volume of subarachnoid in the right calcarine sulcus. THERE IS new confluent abnormal density in the left occipital lobe, PCA territory. This resembles cytotoxic edema and is new from prior studies. There is a small area of encephalomalacia in the right occipital lobe which is stable since August 2016. Small chronic left cerebellar infarct is stable. Deep gray matter and brainstem gray-white matter differentiation is preserved. Vascular: Calcified atherosclerosis at the skull base. Skull: No skull fracture identified. Sinuses/Orbits: Visualized paranasal sinuses and mastoids are stable and well pneumatized. Other: No scalp hematoma identified. Rightward gaze deviation, otherwise no acute orbit findings. ASPECTS [facility] Stroke Program Early CT Score) Total score (0-10 with 10 being normal): Not applicable, acute intracranial hemorrhage. IMPRESSION: 1. Large intra-axial hemorrhage in the posterior right MCA territory. Consider hemorrhagic transformation of an underlying acute infarct. Intra-axial blood volume estimated at 99 mL. Surrounding edema with relatively mild regional mass effect. No midline shift or loss of basilar cisterns. 2. Moderate volume of intraventricular extension of blood into the right lateral ventricle. No ventriculomegaly at this time. 3. Superimposed trace bilateral subarachnoid hemorrhage, most notably over the left superior convexity. This might reflect superimposed posttraumatic hemorrhage from the reported fall. 4. Acute to subacute left PCA territory infarct affecting the occipital lobe. Small chronic infarcts in the right  PCA territory and left cerebellum are stable since August 2016. 5. ASPECTS is not applicable, acute intracranial hemorrhage. 6. Critical Value/emergent results were discussed by telephone with Dr. Roland Rack, at 1729 hours on 07/30/2016, who verbally acknowledged these results. Electronically Signed   By: Genevie Ann M.D.   On: 07/30/2016 17:39    EKG: Independently reviewed. Sinus rhythm   Assessment/Plan ICH intracerebral hemorrhage: Acute. Patient found to have large right MCA hemorrhage. Family will like to pursue comfort care measures only at this time. - Admitted to [place] care  measures orderset initiated  - Discontinue all pending and future lab draws  - Discontinue monitoring   - aspiration precaution npo   - Vital signs routine  - Foley catheter per family request   - Maintain IV with fluids KVO   - Morphine prn pain  - Ativan prn anxiety  - haloperidol prn agitation  - Glycopyrrolate prn secretions   - Zofran prn N/V  - DuoNeb's prn shortness of breath or use   -  RN may pronounce death  - spiritual consult  - Palliative care consult  DVT prophylaxis: none Code Status: DNR Family Communication:  Discussed with family plan of care Disposition Plan: TBD  Consults called: Neurology Admission status: Inpatient  Norval Morton MD [facility] Pager [phone]  If 7PM-7AM, please contact night-coverage [email or web address] Password [email or web address]  08/22/2016, 8:03 PM

## 2016-07-28 NOTE — ED Notes (Signed)
Admitting at bedside 

## 2016-07-28 NOTE — H&P (Addendum)
History and physical          ICH    HPI:                                                                                                                                         [name] is an 83 y.o. female with known abdominal aortic aneurysm twitch she did not want to have surgery, CAD, carotid artery occlusion, hyperlipidemia, hypertension, and previous stroke in 2013. Today she was out with her daughter at [place] out, after eating the apparently walked to the car she was feeling fine but then when she attempted to get into the car she cannot lift her right left leg. At that point she fell to the ground. Initially when EMS arrived patient was able to communicate with them tell them that she was cold however and route her speech became worse and her left arm and leg became weaker. On arrival patient had a right gaze deviation, unable to lift her left arm or leg. Initial CT was done and showed a large right parietal intracranial hemorrhage.  Date last known well: Date: 08/23/2016 Time last known well: Time: 16:15 tPA Given: No: ich   Past Medical History:  Diagnosis Date  . Abdominal aortic aneurysm ([place])   . Aortic insufficiency   . Broken hip ([place])    left  . CAD (coronary artery disease)   . Carotid artery occlusion   . DJD (degenerative joint disease)   . Hyperlipidemia   . Hypertension   . Osteopenia   . Stroke [facility]) 2013  and  Feb. 2016    Past Surgical History:  Procedure Laterality Date  . ABDOMINAL HYSTERECTOMY    . APPENDECTOMY    . CARDIAC CATHETERIZATION  2000   PTCA,and stenting to RCA  . CATARACT EXTRACTION    . CHOLECYSTECTOMY    . EYE SURGERY    . FRACTURE SURGERY Left   . HIP FRACTURE SURGERY     Left hip ORIF  . PTCA    . TONSILLECTOMY      Family History  Problem Relation Age of Onset  . Coronary artery disease Mother   . Heart disease Mother   . Dementia Sister   . Heart disease Sister   . Coronary artery disease Brother   . Cancer  Sister     Cervical or Ovarian   Social History:  reports that she quit smoking about 5 years ago. Her smoking use included Cigarettes. She quit after 50.00 years of use. She has never used smokeless tobacco. She reports that she does not drink alcohol or use drugs.  Allergies:  Allergies  Allergen Reactions  . Alendronate Sodium   . Chlordiazepoxide   . Librium   . Zocor [Simvastatin]     Leg cramps    Medications:  No current facility-administered medications for this encounter.    Current Outpatient Prescriptions  Medication Sig Dispense Refill  . amLODipine (NORVASC) 10 MG tablet Take 1 tablet (10 mg total) by mouth daily. 90 tablet 1  . clopidogrel (PLAVIX) 75 MG tablet Take 1 tablet (75 mg total) by mouth daily. 90 tablet 3  . metoprolol tartrate (LOPRESSOR) 25 MG tablet Take 1 tablet (25 mg total) by mouth 2 (two) times daily. 180 tablet 3  . Multiple Vitamins-Minerals (PRESERVISION AREDS 2 PO) Take by mouth 2 (two) times daily.    . nitroGLYCERIN (NITROSTAT) 0.4 MG SL tablet Place 1 tablet (0.4 mg total) under the tongue every 5 (five) minutes as needed. 25 tablet 11  . oxyCODONE (ROXICODONE) 5 MG immediate release tablet Take 0.5 tablets (2.5 mg total) by mouth every 4 (four) hours as needed for severe pain. 10 tablet 0  . pravastatin (PRAVACHOL) 40 MG tablet Take 1 tablet (40 mg total) by mouth daily. 90 tablet 3     ROS:                                                                                                                                       History obtained from unobtainable from patient due to mental status  General ROS: negative for - chills, fatigue, fever, night sweats, weight gain or weight loss Psychological ROS: negative for - behavioral disorder, hallucinations, memory difficulties, mood swings or suicidal ideation Ophthalmic ROS:  negative for - blurry vision, double vision, eye pain or loss of vision ENT ROS: negative for - epistaxis, nasal discharge, oral lesions, sore throat, tinnitus or vertigo Allergy and Immunology ROS: negative for - hives or itchy/watery eyes Hematological and Lymphatic ROS: negative for - bleeding problems, bruising or swollen lymph nodes Endocrine ROS: negative for - galactorrhea, hair pattern changes, polydipsia/polyuria or temperature intolerance Respiratory ROS: negative for - cough, hemoptysis, shortness of breath or wheezing Cardiovascular ROS: negative for - chest pain, dyspnea on exertion, edema or irregular heartbeat Gastrointestinal ROS: negative for - abdominal pain, diarrhea, hematemesis, nausea/vomiting or stool incontinence Genito-Urinary ROS: negative for - dysuria, hematuria, incontinence or urinary frequency/urgency Musculoskeletal ROS: negative for - joint swelling or muscular weakness Neurological ROS: as noted in HPI Dermatological ROS: negative for rash and skin lesion changes  Neurologic Examination:                                                                                                      Weight 53.2  kg (117 lb 4.6 oz).    Neurological Examination Mental Status: Patient is alert she is nonvocal she is able to stick her tongue out when asked, she has a right gaze preference and is not moving her left arm or leg. She does attempt to follow some instructions with her right side. Cranial Nerves: II: Patient has a left hemianopsia to BTT III,IV, VI: Bilateral pupils are equal round reactive, she has a right gaze preference and does not cross midline  V,VII: Patient has a left facial droop VIII: hearing normal bilaterally IX,X: uvula rises symmetrically XI: bilateral shoulder shrug XII: midline tongue extension Motor: Right : Upper extremity   5/5    Left:     Upper extremity   1/5  Lower extremity   5/5     Lower extremity   1/5 Tone and bulk:normal tone  throughout; no atrophy noted Sensory: Pinprick and light touch intact throughout, bilaterally Deep Tendon Reflexes: 2+ and symmetric throughout Plantars: Right: downgoing   Left: downgoing Cerebellar: normal finger-to-nose, normal rapid alternating movements and normal heel-to-shin test Gait: normal gait and station    Lab Results: Basic Metabolic Panel: No results for input(s): NA, K, CL, CO2, GLUCOSE, BUN, CREATININE, CALCIUM, MG, PHOS in the last 168 hours.  Liver Function Tests: No results for input(s): AST, ALT, ALKPHOS, BILITOT, PROT, ALBUMIN in the last 168 hours. No results for input(s): LIPASE, AMYLASE in the last 168 hours. No results for input(s): AMMONIA in the last 168 hours.  CBC: No results for input(s): WBC, NEUTROABS, HGB, HCT, MCV, PLT in the last 168 hours.  Cardiac Enzymes: No results for input(s): CKTOTAL, CKMB, CKMBINDEX, TROPONINI in the last 168 hours.  Lipid Panel: No results for input(s): CHOL, TRIG, HDL, CHOLHDL, VLDL, LDLCALC in the last 168 hours.  CBG: No results for input(s): GLUCAP in the last 168 hours.  Microbiology: Results for orders placed or performed during the hospital encounter of 08/11/14  Blood culture (routine x 2)     Status: None   Collection Time: 08/11/14  7:24 PM  Result Value Ref Range Status   Specimen Description BLOOD RIGHT ARM  Final   Special Requests BOTTLES DRAWN AEROBIC AND ANAEROBIC 10CC  Final   Culture   Final    NO GROWTH 5 DAYS Performed at [facility]    Report Status 08/18/2014 FINAL  Final  Blood culture (routine x 2)     Status: None   Collection Time: 08/11/14  7:30 PM  Result Value Ref Range Status   Specimen Description BLOOD RIGHT HAND  Final   Special Requests BOTTLES DRAWN AEROBIC ONLY 10CC  Final   Culture   Final    NO GROWTH 5 DAYS Performed at [facility]    Report Status 08/18/2014 FINAL  Final    Coagulation Studies: No results for input(s): LABPROT, INR in the last  72 hours.  Imaging: No results found.     Assessment and plan discussed with with attending physician and they are in agreement.    Sierra Quill PA-C [facility] [phone]  07/30/2016, 5:24 PM  I have seen the patient and reviewed the note.  ICH score 3  Assessment: 83 y.o. female presenting to [facility] emergency department with sudden onset left-sided weakness right gaze preference and abdominal pain. CT scan of brain shows an acute right parietal intracranial hemorrhage. There is also noted hyperacute blood and the right ventricle. I think that her prognosis is fairly poor, but I  discussed via the phone with the daughter who indicated that she would want everything done, stating "Do anything to save her!"  Stroke Risk Factors - hyperlipidemia and hypertension  1) Admit to ICU 2) no antiplatelets or anticoagulants 3) blood pressure control with goal systolic < [id number] 4) Frequent neuro checks 5) If symptoms worsen or there is decreased mental status, repeat stat head CT 6) PT,OT,ST 7) continue to discuss goals of care with family  This patient is critically ill and at significant risk of neurological worsening, death and care requires constant monitoring of vital signs, hemodynamics,respiratory and cardiac monitoring, neurological assessment, discussion with family, other specialists and medical decision making of high complexity. I spent 45 minutes of neurocritical care time  in the care of  this patient.  Roland Rack, MD [facility] [phone]  If 7pm- 7am, please page neurology on call as listed in [place]. 08/13/2016

## 2016-07-28 NOTE — ED Provider Notes (Signed)
[facility] DEPT Provider Note   CSN: [id number] Arrival date & time: 07/27/2016  1712     History   Chief Complaint Chief Complaint  Patient presents with  . Code Stroke    HPI [name] is a 83 y.o. female.  The history is provided by the EMS personnel.  Cerebrovascular Accident  This is a recurrent problem. The current episode started less than 1 hour ago. The problem has been rapidly worsening. Pertinent negatives include no chest pain and no shortness of breath.  -Patient reportedly fell at the nursing home and was found to have left-sided hemiparesis.  Past Medical History:  Diagnosis Date  . Abdominal aortic aneurysm ([place])   . Aortic insufficiency   . Broken hip ([place])    left  . CAD (coronary artery disease)   . Carotid artery occlusion   . DJD (degenerative joint disease)   . Hyperlipidemia   . Hypertension   . Osteopenia   . Stroke [facility]) 2013  and  Feb. 2016    Patient Active Problem List   Diagnosis Date Noted  . ICH (intracerebral hemorrhage) ([place]) 08/20/2016  . Thoracoabdominal aortic aneurysm, without rupture ([place]) 11/21/2014  . AKI (acute kidney injury) ([place]) 08/12/2014  . Cerebral infarction due to thrombosis of right middle cerebral artery ([place])   . Dementia   . Encephalopathy 08/11/2014  . Carotid stenosis 04/25/2014  . TIA (transient ischemic attack) 05/13/2011  . Aortic insufficiency   . OSTEOPENIA 01/15/2009  . WEIGHT LOSS 01/15/2009  . Hyperlipidemia 05/01/2007  . Essential hypertension 05/01/2007  . CAD (coronary artery disease) 05/01/2007    Past Surgical History:  Procedure Laterality Date  . ABDOMINAL HYSTERECTOMY    . APPENDECTOMY    . CARDIAC CATHETERIZATION  2000   PTCA,and stenting to RCA  . CATARACT EXTRACTION    . CHOLECYSTECTOMY    . EYE SURGERY    . FRACTURE SURGERY Left   . HIP FRACTURE SURGERY     Left hip ORIF  . PTCA    . TONSILLECTOMY      OB History    No data available       Home Medications      Prior to Admission medications   Medication Sig Start Date End Date Taking? Authorizing Provider  amLODipine (NORVASC) 10 MG tablet Take 1 tablet (10 mg total) by mouth daily. 05/16/16   Thayer Headings, MD  clopidogrel (PLAVIX) 75 MG tablet Take 1 tablet (75 mg total) by mouth daily. 10/20/15   Thayer Headings, MD  metoprolol tartrate (LOPRESSOR) 25 MG tablet Take 1 tablet (25 mg total) by mouth 2 (two) times daily. 10/20/15   Thayer Headings, MD  Multiple Vitamins-Minerals (PRESERVISION AREDS 2 PO) Take by mouth 2 (two) times daily.    Historical Provider, MD  nitroGLYCERIN (NITROSTAT) 0.4 MG SL tablet Place 1 tablet (0.4 mg total) under the tongue every 5 (five) minutes as needed. 10/20/15   Thayer Headings, MD  oxyCODONE (ROXICODONE) 5 MG immediate release tablet Take 0.5 tablets (2.5 mg total) by mouth every 4 (four) hours as needed for severe pain. 03/01/15   Deno Etienne, DO  pravastatin (PRAVACHOL) 40 MG tablet Take 1 tablet (40 mg total) by mouth daily. 10/20/15   Thayer Headings, MD    Family History Family History  Problem Relation Age of Onset  . Coronary artery disease Mother   . Heart disease Mother   . Dementia Sister   . Heart disease Sister   .  Cancer Sister     Cervical or Ovarian  . Coronary artery disease Brother     Social History Social History  Substance Use Topics  . Smoking status: Former Smoker    Years: 50.00    Types: Cigarettes    Quit date: 01/09/2011  . Smokeless tobacco: Never Used  . Alcohol use No     Allergies   Alendronate sodium; Chlordiazepoxide; Librium; and Zocor [simvastatin]   Review of Systems Review of Systems  Unable to perform ROS: Acuity of condition  Respiratory: Negative for shortness of breath.   Cardiovascular: Negative for chest pain.  Neurological: Positive for weakness.     Physical Exam Updated Vital Signs BP 111/56   Pulse 99   Temp 97.8 F (36.6 C) (Rectal)   Resp 21   Wt 53.2 kg   SpO2 97%   BMI 20.13 kg/m    Physical Exam  Constitutional: She appears well-developed. She appears lethargic. She appears distressed.  Cachectic   HENT:  Mouth/Throat: Oropharynx is clear and moist.  Eyes: Pupils are equal, round, and reactive to light.  R sided gaze deviation  Cardiovascular: Normal rate and regular rhythm.   Pulmonary/Chest: Effort normal and breath sounds normal. No respiratory distress.  Abdominal: She exhibits mass (pulsatile). There is no tenderness.  Neurological: She appears lethargic. A sensory deficit is present. GCS eye subscore is 4. GCS verbal subscore is 4. GCS motor subscore is 6.  0/5 strength LUE, LLE  Skin:  Bruising b/l forearms  Nursing note and vitals reviewed.    ED Treatments / Results  Labs (all labs ordered are listed, but only abnormal results are displayed) Labs Reviewed  CBC - Abnormal; Notable for the following:       Result Value   WBC 16.8 (*)    RBC 3.84 (*)    Hemoglobin 11.4 (*)    HCT 34.1 (*)    All other components within normal limits  DIFFERENTIAL - Abnormal; Notable for the following:    Neutro Abs 14.6 (*)    Monocytes Absolute 1.3 (*)    All other components within normal limits  COMPREHENSIVE METABOLIC PANEL - Abnormal; Notable for the following:    CO2 19 (*)    Glucose, Bld 169 (*)    BUN 24 (*)    Creatinine, Ser 1.67 (*)    Calcium 8.8 (*)    GFR calc non Af Amer 26 (*)    GFR calc Af Amer 30 (*)    All other components within normal limits  CBG MONITORING, ED - Abnormal; Notable for the following:    Glucose-Capillary 179 (*)    All other components within normal limits  I-STAT CHEM 8, ED - Abnormal; Notable for the following:    BUN 25 (*)    Creatinine, Ser 1.60 (*)    Glucose, Bld 163 (*)    Calcium, Ion 1.13 (*)    Hemoglobin 11.6 (*)    HCT 34.0 (*)    All other components within normal limits  PROTIME-INR  APTT  I-STAT TROPOININ, ED    EKG  EKG Interpretation None       Radiology Ct Head Code Stroke W/o  Cm  Result Date: 08/07/2016 CLINICAL DATA:  Code stroke. 83 year old female last seen normal at 1650 hours. The patient is known to have fallen since that time. Initial encounter. EXAM: CT HEAD WITHOUT CONTRAST TECHNIQUE: Contiguous axial images were obtained from the base of the skull through the vertex without intravenous  contrast. COMPARISON:  Head and cervical spine CT 03/01/2015. Brain MRI 08/11/2014, and earlier. FINDINGS: Brain: Large intra-axial hemorrhage in the posterior right frontal and right parietal lobes. Hyperdense blood products encompass 69 x 53 x 54 mm (AP by transverse by CC), for an estimated intra-axial blood volume of 99 mL. Interestingly, some of the hemorrhage distribution is gyriform particularly along what appears to be the central sulcus (series 21, image 25). There is surrounding edema. There is regional mass effect which is fairly mild in part owing to the underlying generalized cerebral volume loss. No midline shift at this time. No loss of basilar cisterns. There is a moderate volume of intraventricular extension of hemorrhage into the right lateral ventricle. The other ventricles are spared at this time. No ventriculomegaly. There are multiple small foci of superimposed subarachnoid hemorrhage, but most of these are over the contralateral left superior convexity. There also appears to be a small volume of subarachnoid in the right calcarine sulcus. THERE IS new confluent abnormal density in the left occipital lobe, PCA territory. This resembles cytotoxic edema and is new from prior studies. There is a small area of encephalomalacia in the right occipital lobe which is stable since August 2016. Small chronic left cerebellar infarct is stable. Deep gray matter and brainstem gray-white matter differentiation is preserved. Vascular: Calcified atherosclerosis at the skull base. Skull: No skull fracture identified. Sinuses/Orbits: Visualized paranasal sinuses and mastoids are stable and  well pneumatized. Other: No scalp hematoma identified. Rightward gaze deviation, otherwise no acute orbit findings. ASPECTS [facility] Stroke Program Early CT Score) Total score (0-10 with 10 being normal): Not applicable, acute intracranial hemorrhage. IMPRESSION: 1. Large intra-axial hemorrhage in the posterior right MCA territory. Consider hemorrhagic transformation of an underlying acute infarct. Intra-axial blood volume estimated at 99 mL. Surrounding edema with relatively mild regional mass effect. No midline shift or loss of basilar cisterns. 2. Moderate volume of intraventricular extension of blood into the right lateral ventricle. No ventriculomegaly at this time. 3. Superimposed trace bilateral subarachnoid hemorrhage, most notably over the left superior convexity. This might reflect superimposed posttraumatic hemorrhage from the reported fall. 4. Acute to subacute left PCA territory infarct affecting the occipital lobe. Small chronic infarcts in the right PCA territory and left cerebellum are stable since August 2016. 5. ASPECTS is not applicable, acute intracranial hemorrhage. 6. Critical Value/emergent results were discussed by telephone with Dr. Roland Rack, at 1729 hours on 08/08/2016, who verbally acknowledged these results. Electronically Signed   By: Genevie Ann M.D.   On: 08/19/2016 17:39    Procedures Procedures (including critical care time)  Medications Ordered in ED Medications - No data to display   Initial Impression / Assessment and Plan / ED Course  I have reviewed the triage vital signs and the nursing notes.  Pertinent labs & imaging results that were available during my care of the patient were reviewed by me and considered in my medical decision making (see chart for details).  Clinical Course    Patient is a 83 year old female with history of dementia, hypertension, hyperlipidemia, coronary artery disease, AAA, stroke who presents with left hemiparesis from the  nursing home. Next  Patient arrived as a code stroke. CT scan showed large right-sided hemorrhage. On exam patient has left-sided hemiparesis and right-sided deviated gaze. Mental status slowly declined while in the ED. I spoke with family who decided to make the patient comfort care and DNR status after hearing the patient's prognosis and severity of condition. Neurology was going  to admit the ICU initially however given she is now [place] patient will be admitted to hospitalists with a palliative care consult.  Pt seen with attending Dr. Venora Maples.    Final Clinical Impressions(s) / ED Diagnoses   Final diagnoses:  ICH (intracerebral hemorrhage) ([place])    New Prescriptions New Prescriptions   No medications on file     Tobie Poet, DO 08/03/2016 [place], MD 07/29/16 [phone]

## 2016-07-28 NOTE — Progress Notes (Signed)
   Responded to ED page.  Met w/ family.  Introduced Art gallery manager.  Will follow, as needed.   - Rev. [place] MDiv ThM

## 2016-07-28 NOTE — ED Notes (Signed)
Patient family at bedside. Given clothes

## 2016-07-28 NOTE — ED Notes (Signed)
Pt CBG was 179, notified Janee(RN)

## 2016-07-29 DIAGNOSIS — Z515 Encounter for palliative care: Secondary | ICD-10-CM

## 2016-07-29 DIAGNOSIS — S06369A Traumatic hemorrhage of cerebrum, unspecified, with loss of consciousness of unspecified duration, initial encounter: Secondary | ICD-10-CM

## 2016-07-29 MED ORDER — GLYCOPYRROLATE 0.2 MG/ML IJ SOLN
0.4000 mg | INTRAMUSCULAR | Status: DC | PRN
  Administered 2016-07-31: 0.4 mg via INTRAVENOUS
  Filled 2016-07-29: qty 2

## 2016-07-29 MED ORDER — WHITE PETROLATUM GEL
Status: AC
  Administered 2016-07-29: 12:00:00
  Filled 2016-07-29: qty 1

## 2016-07-29 MED ORDER — MORPHINE SULFATE (PF) 4 MG/ML IV SOLN
1.0000 mg | INTRAVENOUS | Status: DC | PRN
  Administered 2016-07-29 – 2016-07-31 (×10): 1 mg via INTRAVENOUS
  Filled 2016-07-29 (×10): qty 1

## 2016-07-29 MED ORDER — GLYCOPYRROLATE 0.2 MG/ML IJ SOLN
0.4000 mg | Freq: Four times a day (QID) | INTRAMUSCULAR | Status: DC
  Administered 2016-07-29 – 2016-07-31 (×9): 0.4 mg via INTRAVENOUS
  Filled 2016-07-29 (×10): qty 2

## 2016-07-29 NOTE — Progress Notes (Signed)
Palliative Medicine consult noted. Due to high referral volume, there may be a delay seeing this patient. Please call the Palliative Medicine Team office at [phone] if recommendations are needed in the interim.  Thank you for inviting [place] to see this patient.  Marjie Skiff Avabella Wailes, RN, BSN, [facility] 07/29/2016 8:41 AM Cell [phone] 8:00-4:00 Monday-Friday Office [phone]

## 2016-07-29 NOTE — Progress Notes (Signed)
PROGRESS NOTE    [name]  [id number] DOB: [birth year] DOA: 08/24/2016 PCP: Nyoka Cowden, MD    Brief Narrative:  83 y.o. female with medical history significant of  HTN, HLD, CAD, AAA, CVA; who presents after having a fall. Found to have [place].   Assessment & Plan:   Principal Problem:   ICH (intracerebral hemorrhage) (HCC)/Comfort measures only status - Pt is currently comfort care - Palliative consulted   DVT prophylaxis: None/ comfort care Disposition Plan: Comfort care, palliative assisting   Consultants:   Neurology  Palliative   Procedures: None   Antimicrobials: None  Subjective:  Pt in bed resting comfortably, does not verbally interact with examiner   Objective: Vitals:   07/30/2016 2205 07/29/16 0446 07/29/16 0458 07/29/16 1645  BP: 126/65 121/66  (!) 116/56  Pulse: (!) 107 (!) 110  (!) 114  Resp: (!) 21 (!) 22  20  Temp: 98.3 F (36.8 C) 99.3 F (37.4 C) 97.5 F (36.4 C) 100.3 F (37.9 C)  TempSrc: Axillary Axillary Oral Oral  SpO2: 94% 95%  (!) 89%  Weight: 49.2 kg (108 lb 7.5 oz)       Intake/Output Summary (Last 24 hours) at 07/29/16 1647 Last data filed at 07/29/16 0900  Gross per 24 hour  Intake                0 ml  Output                0 ml  Net                0 ml   Filed Weights   08/18/2016 1700 08/10/2016 2205  Weight: 53.2 kg (117 lb 4.6 oz) 49.2 kg (108 lb 7.5 oz)    Examination:  General exam: in NAD Respiratory system: Tachypneic Cardiovascular system: S1 & S2 heard, RRR.   Data Reviewed: I have personally reviewed following labs and imaging studies  CBC:  Recent Labs Lab 07/26/2016 1727 08/04/2016 1739  WBC 16.8*  --   NEUTROABS 14.6*  --   HGB 11.4* 11.6*  HCT 34.1* 34.0*  MCV 88.8  --   PLT 222  --    Basic Metabolic Panel:  Recent Labs Lab 07/27/2016 1727 07/30/2016 1739  NA 135 137  K 4.0 4.0  CL 105 104  CO2 19*  --   GLUCOSE 169* 163*  BUN 24* 25*  CREATININE 1.67* 1.60*  CALCIUM  8.8*  --    GFR: CrCl cannot be calculated (Unknown ideal weight.). Liver Function Tests:  Recent Labs Lab 08/07/2016 1727  AST 24  ALT 23  ALKPHOS 72  BILITOT 0.6  PROT 6.6  ALBUMIN 3.6   No results for input(s): LIPASE, AMYLASE in the last 168 hours. No results for input(s): AMMONIA in the last 168 hours. Coagulation Profile:  Recent Labs Lab 08/18/2016 1727  INR 1.12   Cardiac Enzymes: No results for input(s): CKTOTAL, CKMB, CKMBINDEX, TROPONINI in the last 168 hours. BNP (last 3 results) No results for input(s): PROBNP in the last 8760 hours. HbA1C: No results for input(s): HGBA1C in the last 72 hours. CBG:  Recent Labs Lab 08/18/2016 1741  GLUCAP 179*   Lipid Profile: No results for input(s): CHOL, HDL, LDLCALC, TRIG, CHOLHDL, LDLDIRECT in the last 72 hours. Thyroid Function Tests: No results for input(s): TSH, T4TOTAL, FREET4, T3FREE, THYROIDAB in the last 72 hours. Anemia Panel: No results for input(s): VITAMINB12, FOLATE, FERRITIN, TIBC, IRON, RETICCTPCT in the last 72  hours. Sepsis Labs: No results for input(s): PROCALCITON, LATICACIDVEN in the last 168 hours.  No results found for this or any previous visit (from the past 240 hour(s)).   Radiology Studies: Ct Head Code Stroke W/o Cm  Result Date: 08/23/2016 CLINICAL DATA:  Code stroke. 83 year old female last seen normal at 1650 hours. The patient is known to have fallen since that time. Initial encounter. EXAM: CT HEAD WITHOUT CONTRAST TECHNIQUE: Contiguous axial images were obtained from the base of the skull through the vertex without intravenous contrast. COMPARISON:  Head and cervical spine CT 03/01/2015. Brain MRI 08/11/2014, and earlier. FINDINGS: Brain: Large intra-axial hemorrhage in the posterior right frontal and right parietal lobes. Hyperdense blood products encompass 69 x 53 x 54 mm (AP by transverse by CC), for an estimated intra-axial blood volume of 99 mL. Interestingly, some of the hemorrhage  distribution is gyriform particularly along what appears to be the central sulcus (series 21, image 25). There is surrounding edema. There is regional mass effect which is fairly mild in part owing to the underlying generalized cerebral volume loss. No midline shift at this time. No loss of basilar cisterns. There is a moderate volume of intraventricular extension of hemorrhage into the right lateral ventricle. The other ventricles are spared at this time. No ventriculomegaly. There are multiple small foci of superimposed subarachnoid hemorrhage, but most of these are over the contralateral left superior convexity. There also appears to be a small volume of subarachnoid in the right calcarine sulcus. THERE IS new confluent abnormal density in the left occipital lobe, PCA territory. This resembles cytotoxic edema and is new from prior studies. There is a small area of encephalomalacia in the right occipital lobe which is stable since August 2016. Small chronic left cerebellar infarct is stable. Deep gray matter and brainstem gray-white matter differentiation is preserved. Vascular: Calcified atherosclerosis at the skull base. Skull: No skull fracture identified. Sinuses/Orbits: Visualized paranasal sinuses and mastoids are stable and well pneumatized. Other: No scalp hematoma identified. Rightward gaze deviation, otherwise no acute orbit findings. ASPECTS [facility] Stroke Program Early CT Score) Total score (0-10 with 10 being normal): Not applicable, acute intracranial hemorrhage. IMPRESSION: 1. Large intra-axial hemorrhage in the posterior right MCA territory. Consider hemorrhagic transformation of an underlying acute infarct. Intra-axial blood volume estimated at 99 mL. Surrounding edema with relatively mild regional mass effect. No midline shift or loss of basilar cisterns. 2. Moderate volume of intraventricular extension of blood into the right lateral ventricle. No ventriculomegaly at this time. 3. Superimposed  trace bilateral subarachnoid hemorrhage, most notably over the left superior convexity. This might reflect superimposed posttraumatic hemorrhage from the reported fall. 4. Acute to subacute left PCA territory infarct affecting the occipital lobe. Small chronic infarcts in the right PCA territory and left cerebellum are stable since August 2016. 5. ASPECTS is not applicable, acute intracranial hemorrhage. 6. Critical Value/emergent results were discussed by telephone with Dr. Roland Rack, at 1729 hours on 08/01/2016, who verbally acknowledged these results. Electronically Signed   By: Genevie Ann M.D.   On: 08/21/2016 17:39    Scheduled Meds: . glycopyrrolate  0.4 mg Intravenous Q6H   Continuous Infusions: . sodium chloride 20 mL/hr at 07/29/16 0033     LOS: 1 day   Time spent: > 35 minutes  Velvet Bathe, MD [facility] Pager [phone]  If 7PM-7AM, please contact night-coverage [email or web address] Password [facility] 07/29/2016, 4:47 PM

## 2016-07-29 NOTE — Progress Notes (Signed)
Palliative Medicine RN Note: Saw pt for initiation of comfort orders. No family in room; called daughter and left message.   Pt is non responsive. PAINAD 0. Shallow breathing w RR 22-24. Per RN symptoms are well-managed with current regimen. However, secretions are building up.   Discussed pt with Dr Hilma Favors and reviewed medications and orders. Pt is on a very good symptom regimen; Dr Hilma Favors gave a few orders to adjust based on EOL order set. Plan for f/u by PMT member tomorrow to ensure nothing else needs to be adjusted.  Marjie Skiff Docia Klar, RN, BSN, [facility] 07/29/2016 2:18 PM Cell [phone] 8:00-4:00 Monday-Friday Office [phone]

## 2016-07-29 NOTE — Progress Notes (Signed)
STROKE TEAM PROGRESS NOTE   HISTORY OF PRESENT ILLNESS (per record) [name] is an 83 y.o. female with known abdominal aortic aneurysm twitch she did not want to have surgery, CAD, carotid artery occlusion, hyperlipidemia, hypertension, and previous stroke in 2013. Today she was out with her daughter at [place] out, after eating the apparently walked to the car she was feeling fine but then when she attempted to get into the car she cannot lift her right left leg. At that point she fell to the ground. Initially when EMS arrived patient was able to communicate with them tell them that she was cold however and route her speech became worse and her left arm and leg became weaker. On arrival patient had a right gaze deviation, unable to lift her left arm or leg. Initial CT was done and showed a large right parietal intracranial hemorrhage. Patient was LKW 08/04/2016 at 1615. Patient was not administered IV t-PA secondary to [place]. She was admitted for comfort care.   SUBJECTIVE (INTERVAL HISTORY) Family not at the bedside. Patient is under comfort care. Palliative care on board.   OBJECTIVE Temp:  [97.5 F (36.4 C)-99.3 F (37.4 C)] 97.5 F (36.4 C) (01/05 0458) Pulse Rate:  [84-112] 110 (01/05 0446) Resp:  [14-27] 22 (01/05 0446) BP: (101-127)/(56-89) 121/66 (01/05 0446) SpO2:  [92 %-99 %] 95 % (01/05 0446) FiO2 (%):  [28 %] 28 % (01/04 2008) Weight:  [49.2 kg (108 lb 7.5 oz)-53.2 kg (117 lb 4.6 oz)] 49.2 kg (108 lb 7.5 oz) (01/04 2205)  CBC:  Recent Labs Lab 08/03/2016 1727 07/29/2016 1739  WBC 16.8*  --   NEUTROABS 14.6*  --   HGB 11.4* 11.6*  HCT 34.1* 34.0*  MCV 88.8  --   PLT 222  --     Basic Metabolic Panel:  Recent Labs Lab 08/22/2016 1727 08/09/2016 1739  NA 135 137  K 4.0 4.0  CL 105 104  CO2 19*  --   GLUCOSE 169* 163*  BUN 24* 25*  CREATININE 1.67* 1.60*  CALCIUM 8.8*  --     Lipid Panel:    Component Value Date/Time   CHOL 134 10/16/2015 0908   TRIG 101  10/16/2015 0908   HDL 58 10/16/2015 0908   CHOLHDL 2.3 10/16/2015 0908   VLDL 20 10/16/2015 0908   LDLCALC 56 10/16/2015 0908   HgbA1c:  Lab Results  Component Value Date   HGBA1C 5.4 08/12/2014   Urine Drug Screen: No results found for: LABOPIA, COCAINSCRNUR, LABBENZ, AMPHETMU, THCU, LABBARB    IMAGING I have personally reviewed the radiological images below and agree with the radiology interpretations.  Ct Head Code Stroke W/o Cm 08/22/2016 1. Large intra-axial hemorrhage in the posterior right MCA territory. Consider hemorrhagic transformation of an underlying acute infarct. Intra-axial blood volume estimated at 99 mL. Surrounding edema with relatively mild regional mass effect. No midline shift or loss of basilar cisterns. 2. Moderate volume of intraventricular extension of blood into the right lateral ventricle. No ventriculomegaly at this time. 3. Superimposed trace bilateral subarachnoid hemorrhage, most notably over the left superior convexity. This might reflect superimposed posttraumatic hemorrhage from the reported fall. 4. Acute to subacute left PCA territory infarct affecting the occipital lobe. Small chronic infarcts in the right PCA territory and left cerebellum are stable since August 2016. 5. ASPECTS is not applicable, acute intracranial hemorrhage.    PHYSICAL EXAM  Temp:  [97.5 F (36.4 C)-100.3 F (37.9 C)] 100.3 F (37.9 C) (01/05  1645) Pulse Rate:  [84-114] 114 (01/05 1645) Resp:  [14-27] 20 (01/05 1645) BP: (101-127)/(56-89) 116/56 (01/05 1645) SpO2:  [89 %-99 %] 89 % (01/05 1645) FiO2 (%):  [28 %] 28 % (01/04 2008) Weight:  [108 lb 7.5 oz (49.2 kg)] 108 lb 7.5 oz (49.2 kg) (01/04 2205)  General - Well nourished, well developed, in mild respiratory distress.  Cardiovascular - Regular rate and rhythm.  Neuro - examination very limited due to comfort care. Patient not responding to voice summation. Did not open eyes. No movement of all extremities. Mild  respiratory distress but no significant discomfort.   ASSESSMENT/PLAN Ms. [name] is a 83 y.o. female with history of AAA, CAD, ICA occlusion, HLD, HTN and previous stroke who developed R leg weakness followed by L hemiparesis and speech difficulty. CT showed a large R MCA hemorrhage with IVH, SAH and L PCA occipital lobe infarct. She was not a tPA candidate secondary to the hemorrhage  Stroke:  Large R frontoparietal ICH with IVH and SAH along with L PCA occipital infarct. Hemorrhage possibly secondary to infarct. Infarcts likely of an embolic source.  Large hemorrhage and ischemic infarct in 83yo with previous stroke and other stroke risk factors. Poor neurologic prognosis. Family wish for comfort care  plavix 75 mg PTA  Patient admitted for comfort care  Palliative care on board   Disposition:  pending   Hypertension  Stable  Hyperlipidemia  Home meds:  pravachol 40 mg daily PTA  Other Stroke Risk Factors  Advanced age  Hx stroke/TIA  Coronary artery disease  Carotid Stenosis, occlusion  Hospital day # 1  Neurology will sign off. Please call with questions. Thanks for the consult.  Rosalin Hawking, MD PhD Stroke Neurology 07/29/2016 5:34 PM   To contact Stroke Continuity provider, please refer to [email or web address] After hours, contact General Neurology

## 2016-07-29 NOTE — Progress Notes (Signed)
   07/29/16 1010  Clinical Encounter Type  Visited With Patient  Visit Type Other (Comment) ([place] consult)  Spiritual Encounters  Spiritual Needs Prayer  Stress Factors  Patient Stress Factors None identified  Offered prayer to Pt.

## 2016-07-30 NOTE — Progress Notes (Signed)
ICH from fall. Comfort measures only. Prn IV morphine being given every 4-5 hours. Upon arrival to room, patient is comfortable. No signs or symptoms of distress. Does not wake to voice. Skin warm/dry. RR 22, regular and non-labored. No family at bedside. Spoke with RN. Informed to contact palliative medicine team with any questions or concerns.   NO CHARGE  Ihor Dow, FNP-C Palliative Medicine Team  Phone: [phone] Fax: [phone]

## 2016-07-30 NOTE — Progress Notes (Signed)
Respirations 24, IV Morphine given. Patient turned and repositioned.

## 2016-07-30 NOTE — Progress Notes (Signed)
PROGRESS NOTE    [name]  [id number] DOB: [birth year] DOA: 08/23/2016 PCP: Nyoka Cowden, MD    Brief Narrative:  83 y.o. female with medical history significant of  HTN, HLD, CAD, AAA, CVA; who presents after having a fall. Found to have [place].   Assessment & Plan:   Principal Problem:   ICH (intracerebral hemorrhage) (HCC)/Comfort measures only status - Continue comfort care measures. - Palliative consulted   DVT prophylaxis: None/ comfort care Disposition Plan: Comfort care, palliative assisting   Consultants:   Neurology  Palliative   Procedures: None   Antimicrobials: None  Subjective:  Pt tachypneic but looks comfortable resting in bed   Objective: Vitals:   07/29/16 0458 07/29/16 1645 07/29/16 1650 07/30/16 0700  BP:  (!) 116/56  115/65  Pulse:  (!) 114  (!) 105  Resp:  20  19  Temp: 97.5 F (36.4 C) 100.3 F (37.9 C)  99.5 F (37.5 C)  TempSrc: Oral Oral  Oral  SpO2:  (!) 89% 92% 94%  Weight:        Intake/Output Summary (Last 24 hours) at 07/30/16 0833 Last data filed at 07/30/16 0700  Gross per 24 hour  Intake              385 ml  Output              520 ml  Net             -135 ml   Filed Weights   08/16/2016 1700 07/29/2016 2205  Weight: 53.2 kg (117 lb 4.6 oz) 49.2 kg (108 lb 7.5 oz)    Examination:  General exam: in NAD Respiratory system: Tachypneic Cardiovascular system: S1 & S2 heard, RRR.   Data Reviewed: I have personally reviewed following labs and imaging studies  CBC:  Recent Labs Lab 07/27/2016 1727 08/11/2016 1739  WBC 16.8*  --   NEUTROABS 14.6*  --   HGB 11.4* 11.6*  HCT 34.1* 34.0*  MCV 88.8  --   PLT 222  --    Basic Metabolic Panel:  Recent Labs Lab 08/07/2016 1727 07/25/2016 1739  NA 135 137  K 4.0 4.0  CL 105 104  CO2 19*  --   GLUCOSE 169* 163*  BUN 24* 25*  CREATININE 1.67* 1.60*  CALCIUM 8.8*  --    GFR: CrCl cannot be calculated (Unknown ideal weight.). Liver Function  Tests:  Recent Labs Lab 08/05/2016 1727  AST 24  ALT 23  ALKPHOS 72  BILITOT 0.6  PROT 6.6  ALBUMIN 3.6   No results for input(s): LIPASE, AMYLASE in the last 168 hours. No results for input(s): AMMONIA in the last 168 hours. Coagulation Profile:  Recent Labs Lab 08/21/2016 1727  INR 1.12   Cardiac Enzymes: No results for input(s): CKTOTAL, CKMB, CKMBINDEX, TROPONINI in the last 168 hours. BNP (last 3 results) No results for input(s): PROBNP in the last 8760 hours. HbA1C: No results for input(s): HGBA1C in the last 72 hours. CBG:  Recent Labs Lab 08/15/2016 1741  GLUCAP 179*   Lipid Profile: No results for input(s): CHOL, HDL, LDLCALC, TRIG, CHOLHDL, LDLDIRECT in the last 72 hours. Thyroid Function Tests: No results for input(s): TSH, T4TOTAL, FREET4, T3FREE, THYROIDAB in the last 72 hours. Anemia Panel: No results for input(s): VITAMINB12, FOLATE, FERRITIN, TIBC, IRON, RETICCTPCT in the last 72 hours. Sepsis Labs: No results for input(s): PROCALCITON, LATICACIDVEN in the last 168 hours.  No results found for this or any  previous visit (from the past 240 hour(s)).   Radiology Studies: Ct Head Code Stroke W/o Cm  Result Date: 08/21/2016 CLINICAL DATA:  Code stroke. 83 year old female last seen normal at 1650 hours. The patient is known to have fallen since that time. Initial encounter. EXAM: CT HEAD WITHOUT CONTRAST TECHNIQUE: Contiguous axial images were obtained from the base of the skull through the vertex without intravenous contrast. COMPARISON:  Head and cervical spine CT 03/01/2015. Brain MRI 08/11/2014, and earlier. FINDINGS: Brain: Large intra-axial hemorrhage in the posterior right frontal and right parietal lobes. Hyperdense blood products encompass 69 x 53 x 54 mm (AP by transverse by CC), for an estimated intra-axial blood volume of 99 mL. Interestingly, some of the hemorrhage distribution is gyriform particularly along what appears to be the central sulcus  (series 21, image 25). There is surrounding edema. There is regional mass effect which is fairly mild in part owing to the underlying generalized cerebral volume loss. No midline shift at this time. No loss of basilar cisterns. There is a moderate volume of intraventricular extension of hemorrhage into the right lateral ventricle. The other ventricles are spared at this time. No ventriculomegaly. There are multiple small foci of superimposed subarachnoid hemorrhage, but most of these are over the contralateral left superior convexity. There also appears to be a small volume of subarachnoid in the right calcarine sulcus. THERE IS new confluent abnormal density in the left occipital lobe, PCA territory. This resembles cytotoxic edema and is new from prior studies. There is a small area of encephalomalacia in the right occipital lobe which is stable since August 2016. Small chronic left cerebellar infarct is stable. Deep gray matter and brainstem gray-white matter differentiation is preserved. Vascular: Calcified atherosclerosis at the skull base. Skull: No skull fracture identified. Sinuses/Orbits: Visualized paranasal sinuses and mastoids are stable and well pneumatized. Other: No scalp hematoma identified. Rightward gaze deviation, otherwise no acute orbit findings. ASPECTS [facility] Stroke Program Early CT Score) Total score (0-10 with 10 being normal): Not applicable, acute intracranial hemorrhage. IMPRESSION: 1. Large intra-axial hemorrhage in the posterior right MCA territory. Consider hemorrhagic transformation of an underlying acute infarct. Intra-axial blood volume estimated at 99 mL. Surrounding edema with relatively mild regional mass effect. No midline shift or loss of basilar cisterns. 2. Moderate volume of intraventricular extension of blood into the right lateral ventricle. No ventriculomegaly at this time. 3. Superimposed trace bilateral subarachnoid hemorrhage, most notably over the left superior  convexity. This might reflect superimposed posttraumatic hemorrhage from the reported fall. 4. Acute to subacute left PCA territory infarct affecting the occipital lobe. Small chronic infarcts in the right PCA territory and left cerebellum are stable since August 2016. 5. ASPECTS is not applicable, acute intracranial hemorrhage. 6. Critical Value/emergent results were discussed by telephone with Dr. Roland Rack, at 1729 hours on 07/30/2016, who verbally acknowledged these results. Electronically Signed   By: Genevie Ann M.D.   On: 08/07/2016 17:39    Scheduled Meds: . glycopyrrolate  0.4 mg Intravenous Q6H   Continuous Infusions: . sodium chloride 20 mL/hr at 07/29/16 0033     LOS: 2 days   Time spent: > 35 minutes  Velvet Bathe, MD [facility] Pager [phone]  If 7PM-7AM, please contact night-coverage [email or web address] Password [facility] 07/30/2016, 8:33 AM

## 2016-07-31 DIAGNOSIS — I612 Nontraumatic intracerebral hemorrhage in hemisphere, unspecified: Secondary | ICD-10-CM

## 2016-08-01 DIAGNOSIS — S06354D Traumatic hemorrhage of left cerebrum with loss of consciousness of 6 hours to 24 hours, subsequent encounter: Secondary | ICD-10-CM | POA: Diagnosis not present

## 2016-08-04 IMAGING — CT CT HEAD W/O CM
2 series · 16 of 30 positions shown, 20 images · non-contrast
Comparison: 05/09/2011

CLINICAL DATA: Mental status change

EXAM:
CT HEAD WITHOUT CONTRAST
TECHNIQUE: Contiguous axial images were obtained from the base of the skull
through the vertex without intravenous contrast.

[Series 201: head w/o, idose (1) · axial · non-contrast · 0.49mm/px · z∈[+132,+262]mm · 13 of 32 slices shown, 17 images]
[im 3/32  brain]
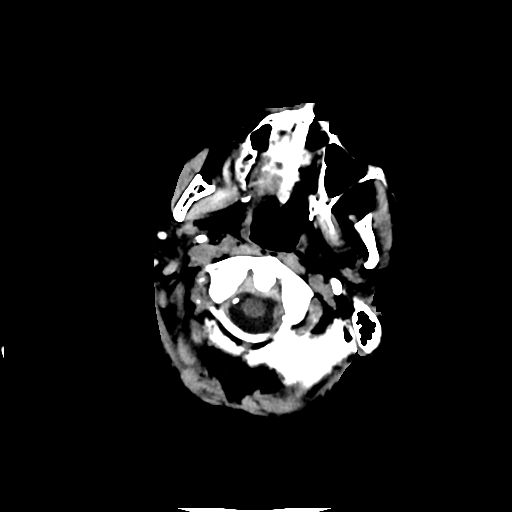
[im 3/32  bone]
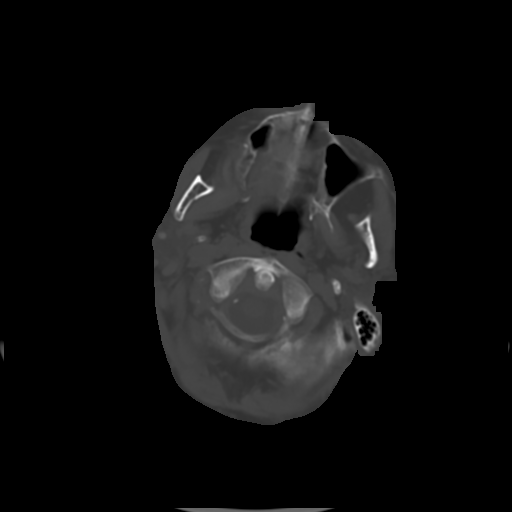
[im 5/32  brain]
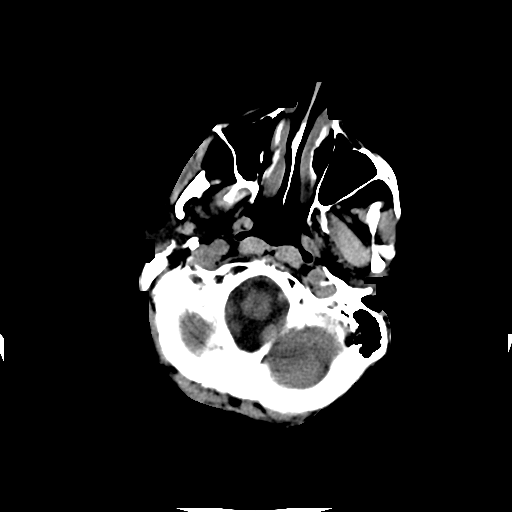
[im 7/32  brain]
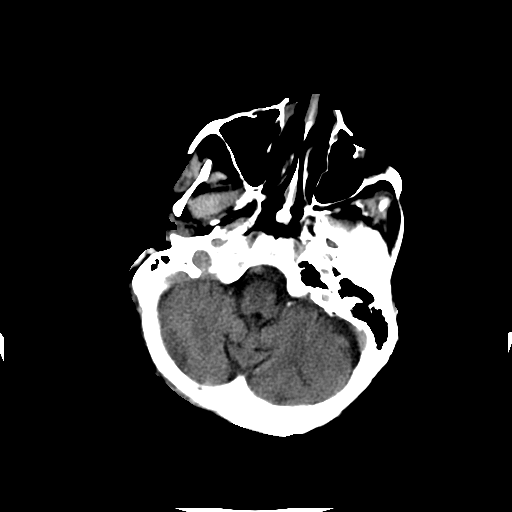
[im 9/32  brain]
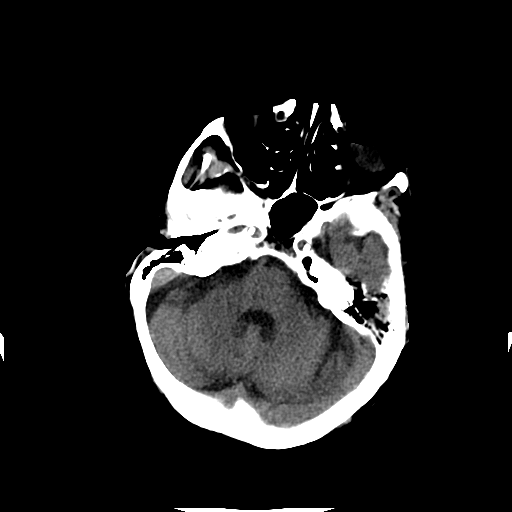
[im 12/32  brain]
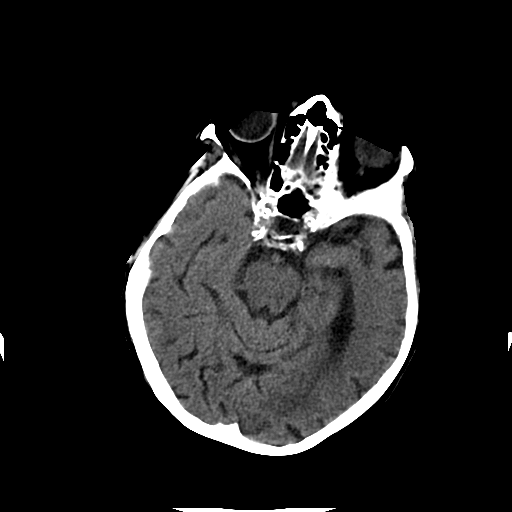
[im 12/32  bone]
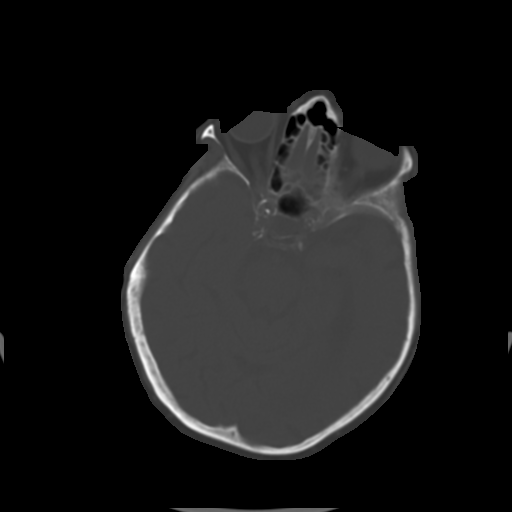
[im 14/32  brain]
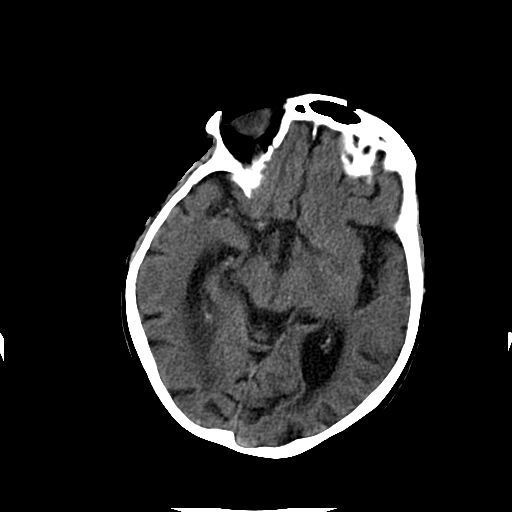
[im 16/32  brain]
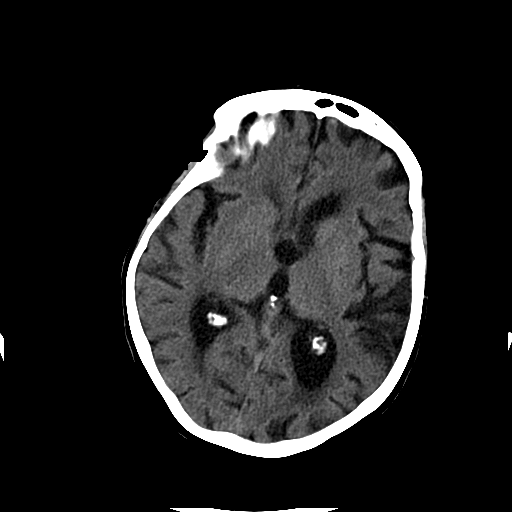
[im 18/32  brain]
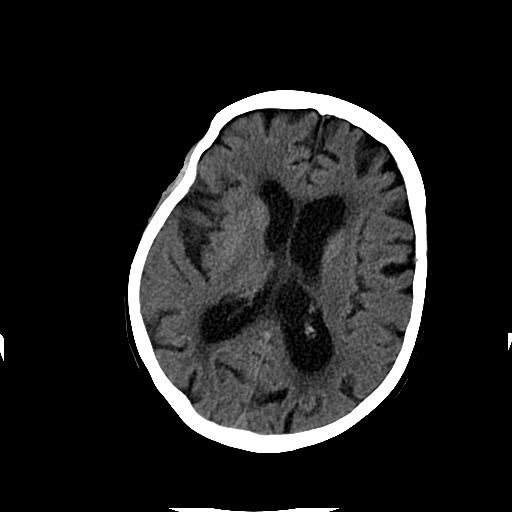
[im 20/32  brain]
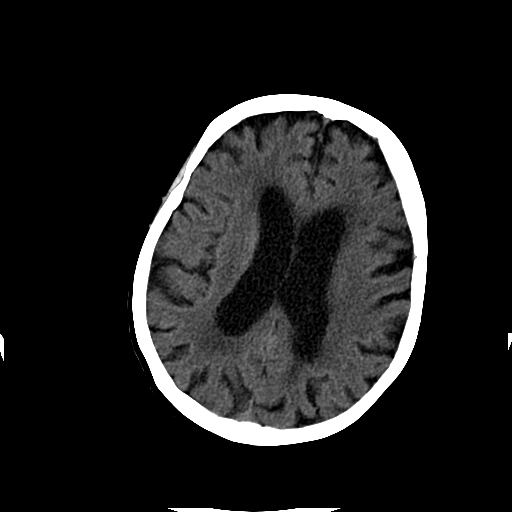
[im 20/32  bone]
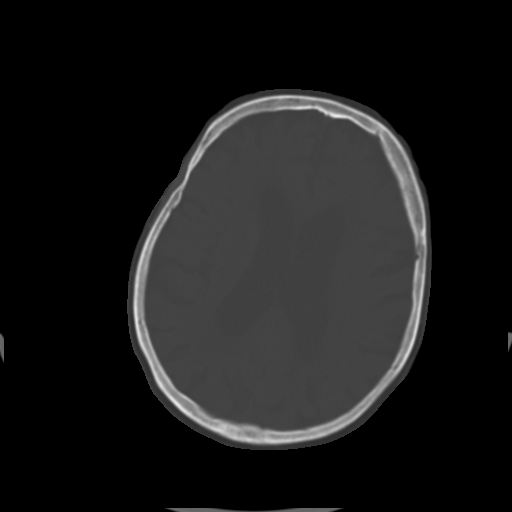
[im 23/32  brain]
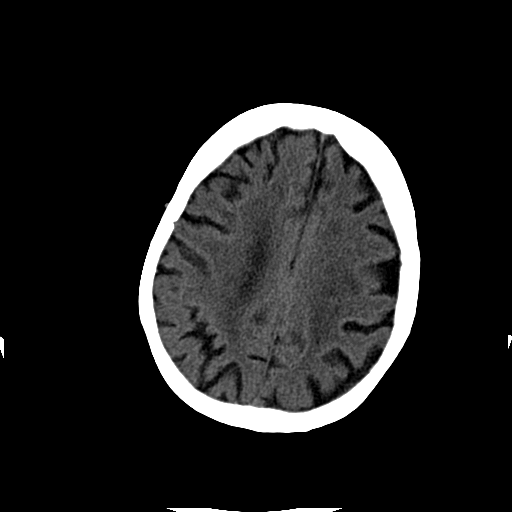
[im 25/32  brain]
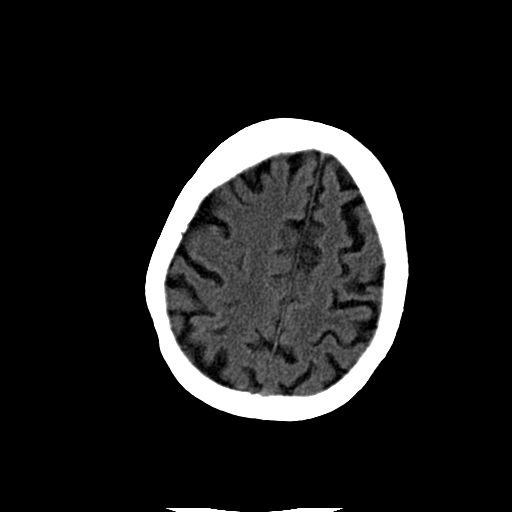
[im 27/32  brain]
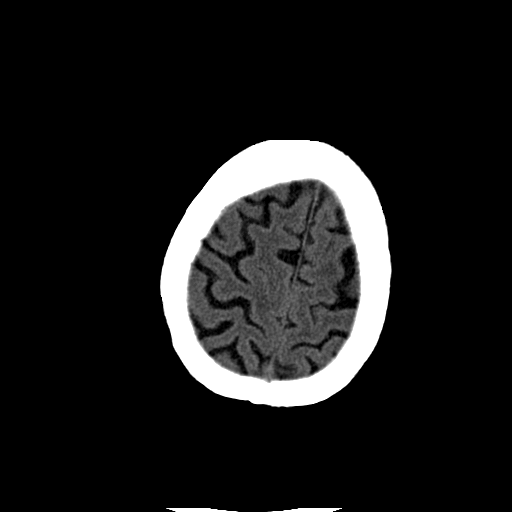
[im 29/32  brain]
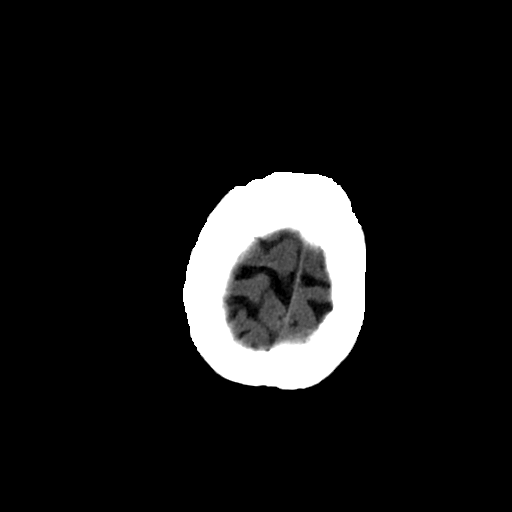
[im 29/32  bone]
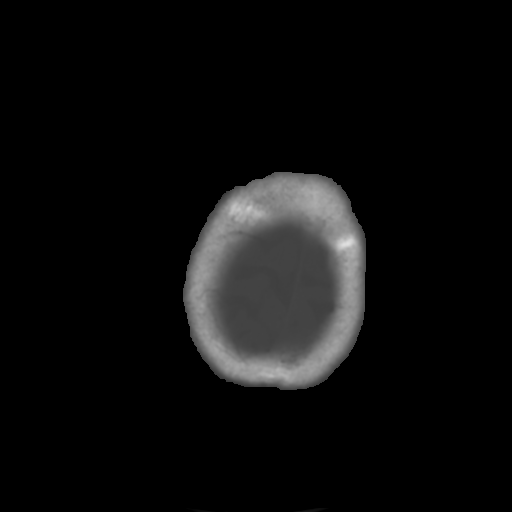

[Series 202: head w/o bone, idose (1) · axial · non-contrast · 0.49mm/px · z∈[+132,+177]mm · 3 of 32 slices shown]
[im 3/32  bone]
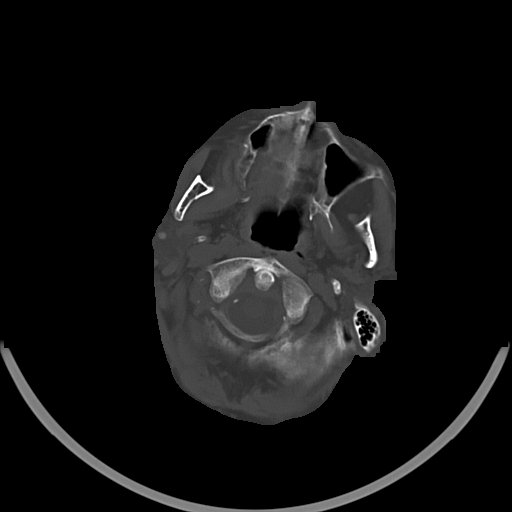
[im 7/32  bone]
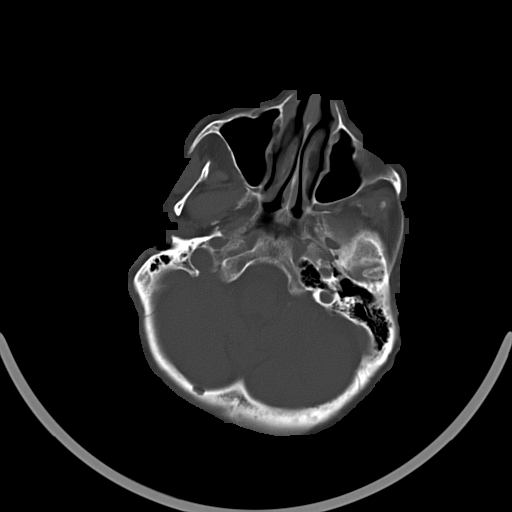
[im 12/32  bone]
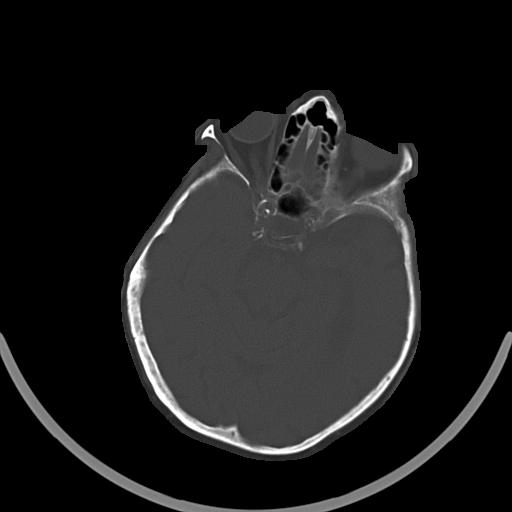

[16 of 30 positions shown; findings below may reference images not displayed]

FINDINGS: Global atrophy. Chronic ischemic changes in the periventricular
white matter. No mass effect, midline shift, or acute hemorrhage.
Mastoid air cells and visualized paranasal sinuses are clear.
Cranium is intact.
IMPRESSION: No acute intracranial pathology.

## 2016-08-04 IMAGING — DX DG CHEST 2V
2 series · 2 of 2 positions shown · non-contrast
Comparison: Chest radiograph 05/13/2011

CLINICAL DATA: Patient with altered mental status.

EXAM:
CHEST  2 VIEW

[chest lat]
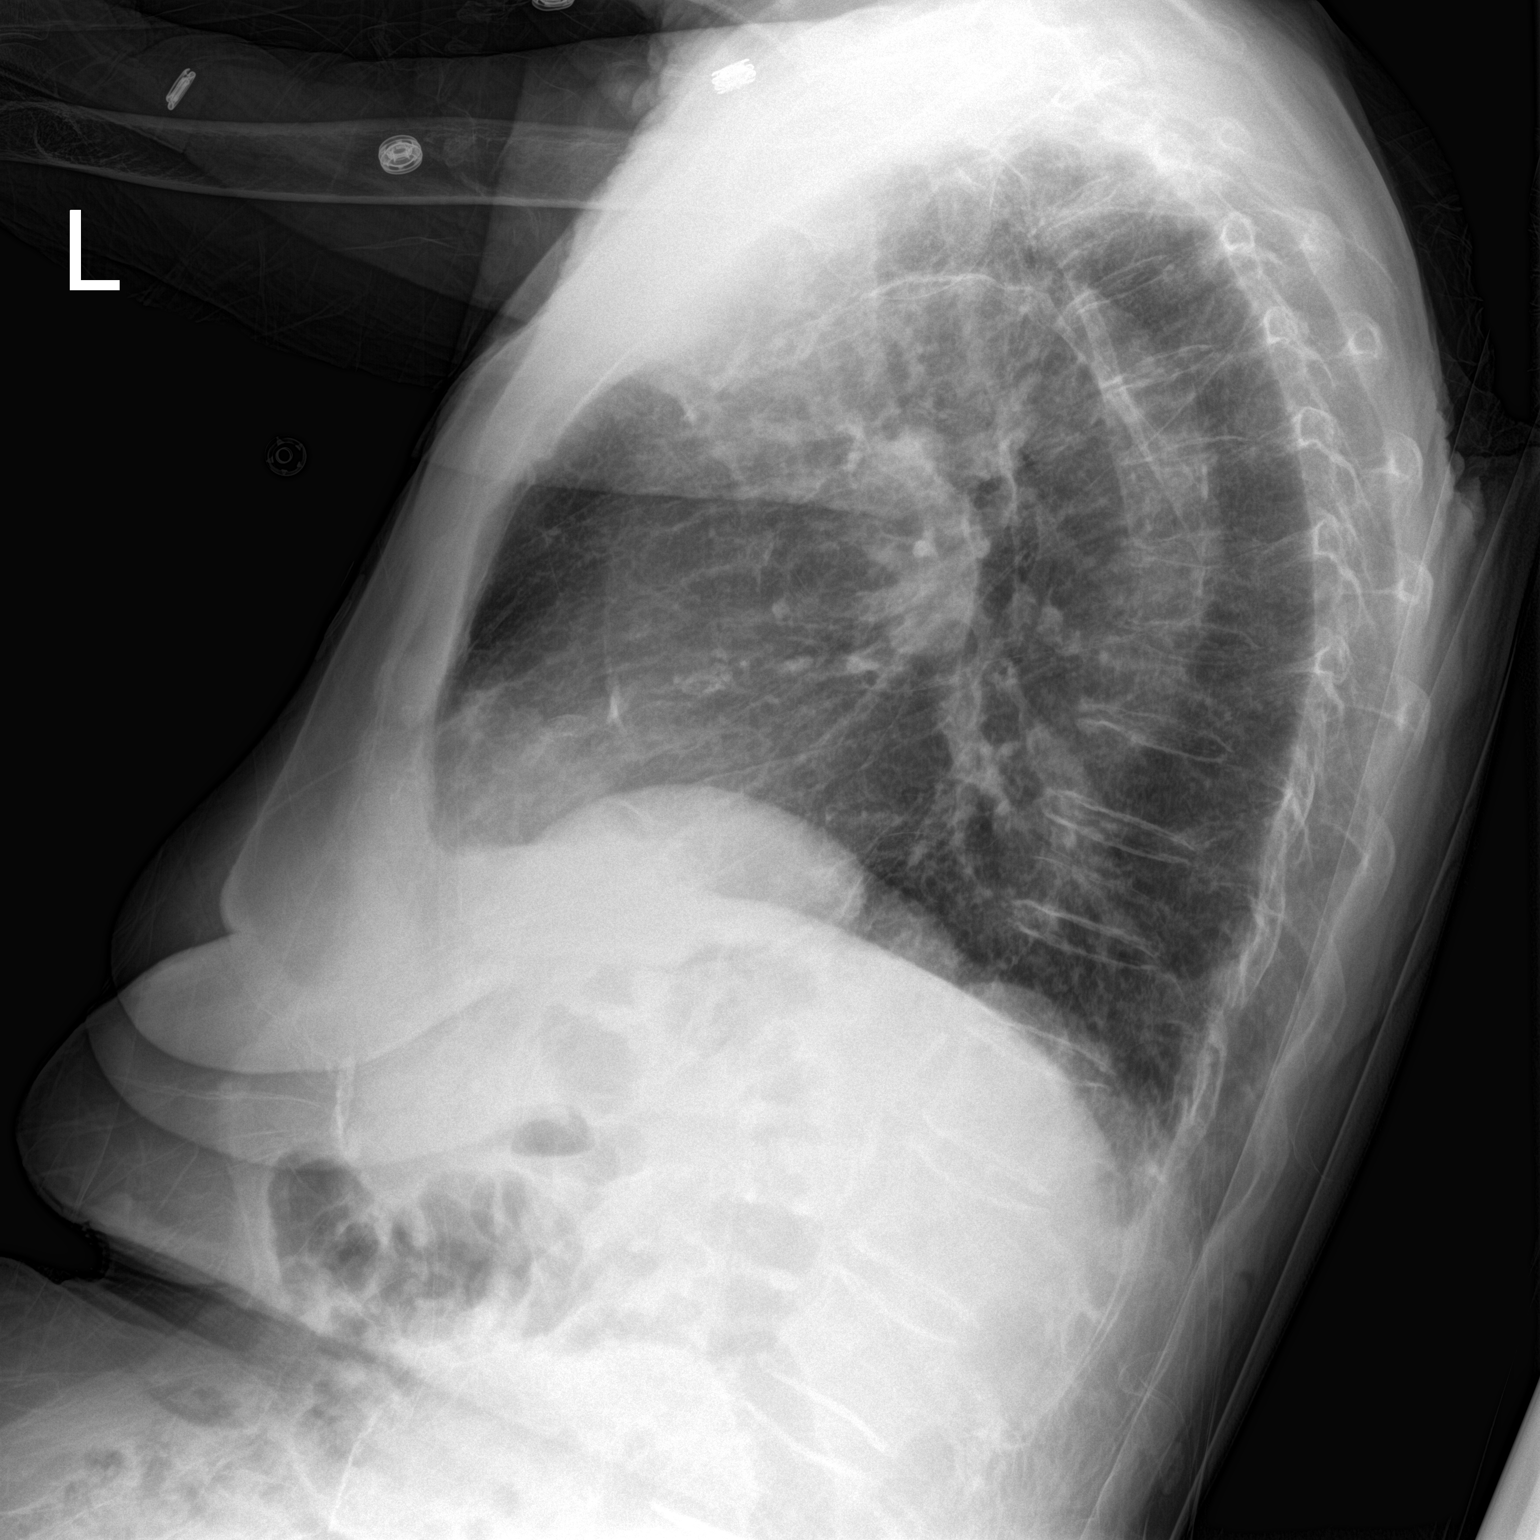

[chest ap]
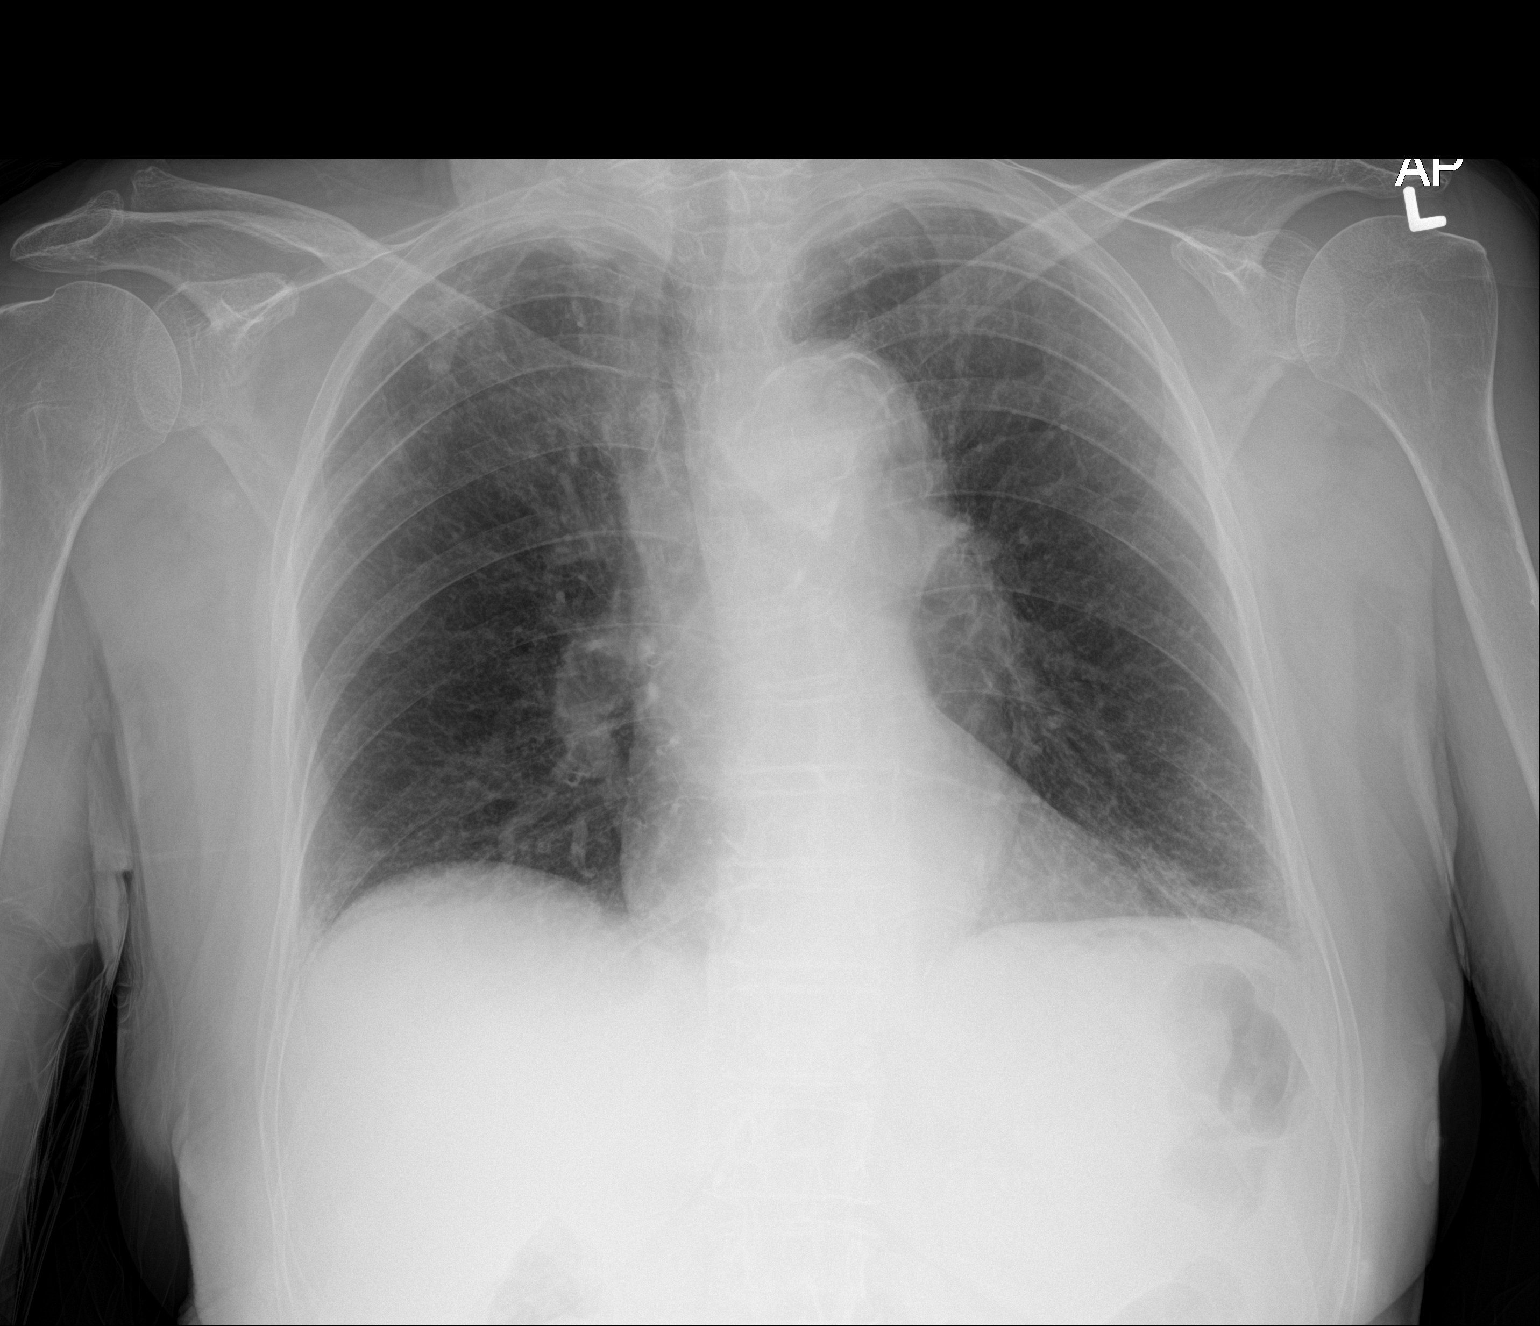

[2 of 2 positions shown; findings below may reference images not displayed]

FINDINGS: Stable cardiac and mediastinal contours with tortuosity and
calcification of the thoracic aorta. Biapical pleural parenchymal
thickening. Stable coarse interstitial pulmonary opacities. No large
consolidative pulmonary opacity. Mid thoracic spine degenerative
change.
IMPRESSION: No acute cardiopulmonary process.

## 2016-08-25 NOTE — Discharge Summary (Addendum)
Death Summary  [name] [id number] DOB: 1923/10/29 DOA: 08/27/2016  PCP: Nyoka Cowden, MD  Admit date: August 27, 2016 Date of Death: Sep 09, 2016  Final Diagnoses:  Principal Problem:   ICH (intracerebral hemorrhage) ([place]) Active Problems:   Comfort measures only status   History of present illness:  Pt who developed ICH addendum: ICH not felt to be secondary to trauma according to note (despite reports of fall) neurologist believes that [place] was "possibly secondary to infart."  Hospital Course:  ICH - died of complications related to [place]   Signed:  Velvet Bathe  [facility] 09-Sep-2016, 11:38 AM

## 2016-08-25 NOTE — Progress Notes (Signed)
PROGRESS NOTE    [name]  [id number] DOB: Jun 29, 1924 DOA: 08/07/2016 PCP: Nyoka Cowden, MD    Brief Narrative:  83 y.o. female with medical history significant of  HTN, HLD, CAD, AAA, CVA; who presents after having a fall. Found to have [place].   Assessment & Plan:   Principal Problem:   ICH (intracerebral hemorrhage) (HCC)/Comfort measures only status - Continue comfort care measures. - Palliative consulted  Pt looks imminent will monitor in house and maintain comfort care measures   DVT prophylaxis: None/ comfort care Disposition Plan: Comfort care, palliative assisting   Consultants:   Neurology  Palliative   Procedures: None   Antimicrobials: None  Subjective:  Pt resting in bed   Objective: Vitals:   07/30/16 0700 07/30/16 1320 08-29-2016 0149 Aug 29, 2016 0300  BP: 115/65 (!) 114/59 (!) 97/46 (!) 94/59  Pulse: (!) 105 (!) 111 (!) 117 (!) 110  Resp: 19 19 19 19   Temp: 99.5 F (37.5 C) 97.5 F (36.4 C)    TempSrc: Oral Oral    SpO2: 94% 93% (!) 89% 95%  Weight:        Intake/Output Summary (Last 24 hours) at 08/29/2016 1251 Last data filed at 29-Aug-2016 0634  Gross per 24 hour  Intake                0 ml  Output              200 ml  Net             -200 ml   Filed Weights   07/30/2016 1700 08/11/2016 2205  Weight: 53.2 kg (117 lb 4.6 oz) 49.2 kg (108 lb 7.5 oz)    Examination:  General exam: in NAD Respiratory system: Tachypneic Cardiovascular system: S1 & S2 heard, RRR.   Data Reviewed: I have personally reviewed following labs and imaging studies  CBC:  Recent Labs Lab 08/13/2016 1727 08/06/2016 1739  WBC 16.8*  --   NEUTROABS 14.6*  --   HGB 11.4* 11.6*  HCT 34.1* 34.0*  MCV 88.8  --   PLT 222  --    Basic Metabolic Panel:  Recent Labs Lab 07/25/2016 1727 08/11/2016 1739  NA 135 137  K 4.0 4.0  CL 105 104  CO2 19*  --   GLUCOSE 169* 163*  BUN 24* 25*  CREATININE 1.67* 1.60*  CALCIUM 8.8*  --    GFR: CrCl  cannot be calculated (Unknown ideal weight.). Liver Function Tests:  Recent Labs Lab 08/10/2016 1727  AST 24  ALT 23  ALKPHOS 72  BILITOT 0.6  PROT 6.6  ALBUMIN 3.6   No results for input(s): LIPASE, AMYLASE in the last 168 hours. No results for input(s): AMMONIA in the last 168 hours. Coagulation Profile:  Recent Labs Lab 08/04/2016 1727  INR 1.12   Cardiac Enzymes: No results for input(s): CKTOTAL, CKMB, CKMBINDEX, TROPONINI in the last 168 hours. BNP (last 3 results) No results for input(s): PROBNP in the last 8760 hours. HbA1C: No results for input(s): HGBA1C in the last 72 hours. CBG:  Recent Labs Lab 08/11/2016 1741  GLUCAP 179*   Lipid Profile: No results for input(s): CHOL, HDL, LDLCALC, TRIG, CHOLHDL, LDLDIRECT in the last 72 hours. Thyroid Function Tests: No results for input(s): TSH, T4TOTAL, FREET4, T3FREE, THYROIDAB in the last 72 hours. Anemia Panel: No results for input(s): VITAMINB12, FOLATE, FERRITIN, TIBC, IRON, RETICCTPCT in the last 72 hours. Sepsis Labs: No results for input(s): PROCALCITON, LATICACIDVEN in  the last 168 hours.  No results found for this or any previous visit (from the past 240 hour(s)).   Radiology Studies: No results found.  Scheduled Meds: . glycopyrrolate  0.4 mg Intravenous Q6H   Continuous Infusions: . sodium chloride 10 mL/hr at 08/12/16 0127     LOS: 3 days   Time spent: > 35 minutes  Velvet Bathe, MD [facility] Pager [phone]  If 7PM-7AM, please contact night-coverage [email or web address] Password [email or web address] 2016-08-12, 12:51 PM

## 2016-08-25 DEATH — deceased
